# Patient Record
Sex: Female | Born: 1965 | Race: Black or African American | Hispanic: No | Marital: Single | Smoking: Former smoker
Health system: Southern US, Community
[De-identification: ages and names within clinical notes are randomized; demographics above are authoritative.]

## PROBLEM LIST (undated history)

## (undated) DIAGNOSIS — L309 Dermatitis, unspecified: Secondary | ICD-10-CM

## (undated) DIAGNOSIS — I10 Essential (primary) hypertension: Secondary | ICD-10-CM

## (undated) DIAGNOSIS — I82890 Acute embolism and thrombosis of other specified veins: Secondary | ICD-10-CM

## (undated) DIAGNOSIS — K449 Diaphragmatic hernia without obstruction or gangrene: Secondary | ICD-10-CM

## (undated) DIAGNOSIS — N840 Polyp of corpus uteri: Secondary | ICD-10-CM

## (undated) HISTORY — DX: Polyp of corpus uteri: N84.0

## (undated) HISTORY — PX: TUBAL LIGATION: SHX77

## (undated) HISTORY — DX: Dermatitis, unspecified: L30.9

## (undated) HISTORY — PX: OTHER SURGICAL HISTORY: SHX169

## (undated) HISTORY — DX: Diaphragmatic hernia without obstruction or gangrene: K44.9

---

## 2006-02-27 ENCOUNTER — Emergency Department (HOSPITAL_COMMUNITY): Admission: EM | Admit: 2006-02-27 | Discharge: 2006-02-27 | Payer: Self-pay | Admitting: Emergency Medicine

## 2006-06-14 ENCOUNTER — Ambulatory Visit (HOSPITAL_COMMUNITY): Admission: RE | Admit: 2006-06-14 | Discharge: 2006-06-14 | Payer: Self-pay | Admitting: Gastroenterology

## 2006-08-10 ENCOUNTER — Emergency Department (HOSPITAL_COMMUNITY): Admission: EM | Admit: 2006-08-10 | Discharge: 2006-08-10 | Payer: Self-pay | Admitting: Family Medicine

## 2006-09-30 ENCOUNTER — Emergency Department (HOSPITAL_COMMUNITY): Admission: EM | Admit: 2006-09-30 | Discharge: 2006-09-30 | Payer: Self-pay | Admitting: Emergency Medicine

## 2007-01-23 ENCOUNTER — Emergency Department (HOSPITAL_COMMUNITY): Admission: EM | Admit: 2007-01-23 | Discharge: 2007-01-23 | Payer: Self-pay | Admitting: Family Medicine

## 2007-03-09 ENCOUNTER — Emergency Department (HOSPITAL_COMMUNITY): Admission: EM | Admit: 2007-03-09 | Discharge: 2007-03-10 | Payer: Self-pay | Admitting: Emergency Medicine

## 2008-07-21 ENCOUNTER — Emergency Department (HOSPITAL_COMMUNITY): Admission: EM | Admit: 2008-07-21 | Discharge: 2008-07-21 | Payer: Self-pay | Admitting: Family Medicine

## 2008-08-21 ENCOUNTER — Emergency Department (HOSPITAL_COMMUNITY): Admission: EM | Admit: 2008-08-21 | Discharge: 2008-08-21 | Payer: Self-pay | Admitting: Emergency Medicine

## 2009-01-19 ENCOUNTER — Emergency Department (HOSPITAL_COMMUNITY): Admission: EM | Admit: 2009-01-19 | Discharge: 2009-01-19 | Payer: Self-pay | Admitting: Family Medicine

## 2010-10-30 ENCOUNTER — Emergency Department (HOSPITAL_COMMUNITY)
Admission: EM | Admit: 2010-10-30 | Discharge: 2010-10-30 | Payer: Self-pay | Source: Home / Self Care | Admitting: Emergency Medicine

## 2011-02-18 NOTE — Op Note (Signed)
NAMECARLEENA, MIRES               ACCOUNT NO.:  [id number]   MEDICAL RECORD NO.:  [id number]          PATIENT TYPE:  AMB   LOCATION:  ENDO                         FACILITY:  [facility]   PHYSICIAN:  James L. Malon Kindle., M.D.DATE OF BIRTH:  10-Sep-1966   DATE OF PROCEDURE:  DATE OF DISCHARGE:  06/14/2006                                 OPERATIVE REPORT   DATE OF PROCEDURE:  06/14/2006   PROCEDURE:  Esophageal monometry.   INDICATION:  Dysphagia with a normal barium swallow.   DESCRIPTION OF PROCEDURE:  The procedure was performed in the usual manner,  no provocative studies were performed.  The five channel express protocol  was used.  The results are as follows:  1. Upper esophageal sphincter appears to relax, pharyngeal spikes are      present.  2. Esophageal body peristalsis was normal and 12 __________ with a mean      amplitude in the distal esophagus 164 mm a mean duration 4.7 seconds      within the normal range.  3. Lower esophageal sphincter mean pressure 43.9 mm within the normal      range appeared to relax on the tracings the computer measured the      relaxation 84% probably normal.   ASSESSMENT:  Essentially normal monometry.   PLAN:  We will follow the patient up in the office.           ______________________________  Llana Aliment. Malon Kindle., M.D.     Waldron Session  D:  07/05/2006  T:  07/06/2006  Job:  [id number]   cc:   Molly Maduro A. Nicholos Johns, M.D.

## 2011-04-20 ENCOUNTER — Emergency Department (HOSPITAL_COMMUNITY)
Admission: EM | Admit: 2011-04-20 | Discharge: 2011-04-20 | Disposition: A | Discharge status: Home / Self Care | Payer: BC Managed Care – PPO | Attending: Emergency Medicine | Admitting: Emergency Medicine

## 2011-04-20 DIAGNOSIS — J45901 Unspecified asthma with (acute) exacerbation: Secondary | ICD-10-CM | POA: Insufficient documentation

## 2011-04-20 DIAGNOSIS — R0989 Other specified symptoms and signs involving the circulatory and respiratory systems: Secondary | ICD-10-CM | POA: Insufficient documentation

## 2011-04-20 DIAGNOSIS — Z8701 Personal history of pneumonia (recurrent): Secondary | ICD-10-CM | POA: Insufficient documentation

## 2011-04-20 DIAGNOSIS — R0609 Other forms of dyspnea: Secondary | ICD-10-CM | POA: Insufficient documentation

## 2011-07-04 ENCOUNTER — Inpatient Hospital Stay (INDEPENDENT_AMBULATORY_CARE_PROVIDER_SITE_OTHER)
Admission: RE | Admit: 2011-07-04 | Discharge: 2011-07-04 | Disposition: A | Discharge status: Home / Self Care | Payer: BC Managed Care – PPO | Source: Ambulatory Visit | Attending: Family Medicine | Admitting: Family Medicine

## 2011-07-04 ENCOUNTER — Ambulatory Visit (INDEPENDENT_AMBULATORY_CARE_PROVIDER_SITE_OTHER): Payer: BC Managed Care – PPO

## 2011-07-04 DIAGNOSIS — IMO0002 Reserved for concepts with insufficient information to code with codable children: Secondary | ICD-10-CM

## 2011-07-21 LAB — BASIC METABOLIC PANEL
BUN: 13
Chloride: 107
Creatinine, Ser: 0.75
GFR calc Af Amer: >60
Glucose, Bld: 102 — ABNORMAL HIGH
Potassium: 3.7
Sodium: 138

## 2011-07-21 LAB — DIFFERENTIAL
Basophils Relative: 0
Eosinophils Absolute: 0.6
Monocytes Absolute: 0.5
Monocytes Relative: 5
Neutrophils Relative %: 63

## 2011-07-21 LAB — CBC
MCHC: 33.6
MCV: 80.3
RBC: 4.61

## 2011-08-23 ENCOUNTER — Other Ambulatory Visit (HOSPITAL_COMMUNITY): Payer: BC Managed Care – PPO | Admitting: Radiology

## 2011-08-29 ENCOUNTER — Other Ambulatory Visit (HOSPITAL_COMMUNITY): Payer: Self-pay | Admitting: Family Medicine

## 2011-08-29 DIAGNOSIS — R609 Edema, unspecified: Secondary | ICD-10-CM

## 2011-08-30 ENCOUNTER — Ambulatory Visit (HOSPITAL_COMMUNITY): Payer: BC Managed Care – PPO | Attending: Cardiology | Admitting: Radiology

## 2011-08-30 ENCOUNTER — Other Ambulatory Visit (HOSPITAL_COMMUNITY): Payer: BC Managed Care – PPO | Admitting: Radiology

## 2011-08-30 DIAGNOSIS — I059 Rheumatic mitral valve disease, unspecified: Secondary | ICD-10-CM | POA: Insufficient documentation

## 2011-08-30 DIAGNOSIS — R609 Edema, unspecified: Secondary | ICD-10-CM | POA: Insufficient documentation

## 2011-08-30 DIAGNOSIS — I379 Nonrheumatic pulmonary valve disorder, unspecified: Secondary | ICD-10-CM | POA: Insufficient documentation

## 2011-08-30 DIAGNOSIS — I079 Rheumatic tricuspid valve disease, unspecified: Secondary | ICD-10-CM | POA: Insufficient documentation

## 2011-08-30 DIAGNOSIS — I319 Disease of pericardium, unspecified: Secondary | ICD-10-CM | POA: Insufficient documentation

## 2011-08-31 ENCOUNTER — Encounter (HOSPITAL_COMMUNITY): Payer: Self-pay | Admitting: Family Medicine

## 2011-09-26 ENCOUNTER — Ambulatory Visit (INDEPENDENT_AMBULATORY_CARE_PROVIDER_SITE_OTHER): Payer: BC Managed Care – PPO | Admitting: Cardiology

## 2011-09-26 ENCOUNTER — Encounter: Payer: Self-pay | Admitting: Cardiology

## 2011-09-26 DIAGNOSIS — R931 Abnormal findings on diagnostic imaging of heart and coronary circulation: Secondary | ICD-10-CM | POA: Insufficient documentation

## 2011-09-26 DIAGNOSIS — Z136 Encounter for screening for cardiovascular disorders: Secondary | ICD-10-CM

## 2011-09-26 DIAGNOSIS — R9389 Abnormal findings on diagnostic imaging of other specified body structures: Secondary | ICD-10-CM

## 2011-09-26 DIAGNOSIS — R0683 Snoring: Secondary | ICD-10-CM

## 2011-09-26 DIAGNOSIS — R0609 Other forms of dyspnea: Secondary | ICD-10-CM

## 2011-09-26 DIAGNOSIS — R609 Edema, unspecified: Secondary | ICD-10-CM

## 2011-09-26 DIAGNOSIS — I10 Essential (primary) hypertension: Secondary | ICD-10-CM

## 2011-09-26 DIAGNOSIS — E669 Obesity, unspecified: Secondary | ICD-10-CM

## 2011-09-26 NOTE — Assessment & Plan Note (Signed)
We had a long discussion about this and I prescribed the [employer].

## 2011-09-26 NOTE — Assessment & Plan Note (Signed)
Her blood pressure is mildly elevated today but has not otherwise and she checks it frequently on her job.

## 2011-09-26 NOTE — Patient Instructions (Addendum)
Please lab work work today. (BNP)  The current medical regimen is effective;  continue present plan and medications.  Your physician has requested that you have an exercise tolerance test. For further information please visit [email or web address] Please also follow instruction sheet, as given.  Your physician has recommended that you have a sleep study. This test records several body functions during sleep, including: brain activity, eye movement, oxygen and carbon dioxide blood levels, heart rate and rhythm, breathing rate and rhythm, the flow of air through your mouth and nose, snoring, body muscle movements, and chest and belly movement.  Sleep Apnea Sleep apnea is when a person's sleep is disrupted many times. This makes a person tired during the day. There are two types of sleep apnea. One is when breathing stops for a short time because your airway is blocked (obstructive sleep apnea). The other type is when the brain sometimes fails to give the normal signal to breathe (central sleep apnea).  HOME CARE  Do not sleep on your back.   Take all medicine as told by your doctor.   Avoid alcohol, calming medicines (sedatives), and depressant drugs.   Use your CPAP (a mask-like machine and pump) to keep your airway open while sleeping.   If you are overweight, try to lose some weight. Talk to your doctor about a healthy weight goal.   If the problem is related to heart failure, take medicines as told by your doctor.  MAKE SURE YOU:  Understand these instructions.   Will watch your condition.   Will get help right away if you are not doing well or get worse.  Document Released: 06/28/2008 Document Revised: 06/01/2011 Document Reviewed: 06/28/2008 [facility] Patient Information 2012 [place], [place]. [facility] Diet  2 Gram Low Sodium Diet A 2 gram sodium diet restricts the amount of sodium in the diet to no more than 2 g or 2000 mg daily. Limiting the amount of sodium is often used to  help lower blood pressure. It is important if you have heart, liver, or kidney problems. Many foods contain sodium for flavor and sometimes as a preservative. When the amount of sodium in a diet needs to be low, it is important to know what to look for when choosing foods and drinks. The following includes some information and guidelines to help make it easier for you to adapt to a low sodium diet. QUICK TIPS  Do not add salt to food.   Avoid convenience items and fast food.   Choose unsalted snack foods.   Buy lower sodium products, often labeled as "lower sodium" or "no salt added."   Check food labels to learn how much sodium is in 1 serving.   When eating at a restaurant, ask that your food be prepared with less salt or none, if possible.  READING FOOD LABELS FOR SODIUM INFORMATION The nutrition facts label is a good place to find how much sodium is in foods. Look for products with no more than 500 to 600 mg of sodium per meal and no more than 150 mg per serving. Remember that 2 g = 2000 mg. The food label may also list foods as:  Sodium-free: Less than 5 mg in a serving.   Very low sodium: 35 mg or less in a serving.   Low-sodium: 140 mg or less in a serving.   Light in sodium: 50% less sodium in a serving. For example, if a food that usually has 300 mg of sodium is changed to  become light in sodium, it will have 150 mg of sodium.   Reduced sodium: 25% less sodium in a serving. For example, if a food that usually has 400 mg of sodium is changed to reduced sodium, it will have 300 mg of sodium.  CHOOSING FOODS Grains  Avoid: Salted crackers and snack items. Some cereals, including instant hot cereals. Bread stuffing and biscuit mixes. Seasoned rice or pasta mixes.   Choose: Unsalted snack items. Low-sodium cereals, oats, puffed wheat and rice, shredded wheat. English muffins and bread. Pasta.  Meats  Avoid: Salted, canned, smoked, spiced, pickled meats, including fish and  poultry. Bacon, ham, sausage, cold cuts, hot dogs, anchovies.   Choose: Low-sodium canned tuna and salmon. Fresh or frozen meat, poultry, and fish.  Dairy  Avoid: Processed cheese and spreads. Cottage cheese. Buttermilk and condensed milk. Regular cheese.   Choose: Milk. Low-sodium cottage cheese. Yogurt. Sour cream. Low-sodium cheese.  Fruits and Vegetables  Avoid: Regular canned vegetables. Regular canned tomato sauce and paste. Frozen vegetables in sauces. Olives. Rosita Fire. Relishes. Sauerkraut.   Choose: Low-sodium canned vegetables. Low-sodium tomato sauce and paste. Frozen or fresh vegetables. Fresh and frozen fruit.  Condiments  Avoid: Canned and packaged gravies. Worcestershire sauce. Tartar sauce. Barbecue sauce. Soy sauce. Steak sauce. Ketchup. Onion, garlic, and table salt. Meat flavorings and tenderizers.   Choose: Fresh and dried herbs and spices. Low-sodium varieties of mustard and ketchup. Lemon juice. Tabasco sauce. Horseradish.  SAMPLE 2 GRAM SODIUM MEAL PLAN Breakfast / Sodium (mg)  1 cup low-fat milk / 143 mg   2 slices whole-wheat toast / 270 mg   1 tbs heart-healthy margarine / 153 mg   1 hard-boiled egg / 139 mg   1 small orange / 0 mg  Lunch / Sodium (mg)  1 cup raw carrots / 76 mg    cup hummus / 298 mg   1 cup low-fat milk / 143 mg    cup red grapes / 2 mg   1 whole-wheat pita bread / 356 mg  Dinner / Sodium (mg)  1 cup whole-wheat pasta / 2 mg   1 cup low-sodium tomato sauce / 73 mg   3 oz lean ground beef / 57 mg   1 small side salad (1 cup raw spinach leaves,  cup cucumber,  cup yellow bell pepper) with 1 tsp olive oil and 1 tsp red wine vinegar / 25 mg  Snack / Sodium (mg)  1 container low-fat vanilla yogurt / 107 mg   3 graham cracker squares / 127 mg  Nutrient Analysis  Calories: 2033   Protein: 77 g   Carbohydrate: 282 g   Fat: 72 g   Sodium: 1971 mg  Document Released: 09/19/2005 Document Revised: 06/01/2011  Document Reviewed: 12/21/2009 [facility] Patient Information 2012 [place], [place].

## 2011-09-26 NOTE — Assessment & Plan Note (Signed)
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 This does not need further evaluation. However, the mildly reduced ejection fraction with questionable regional wall motion abnormality will be further investigated.

## 2011-09-26 NOTE — Assessment & Plan Note (Signed)
She has snoring and other symptoms of sleep apnea and she will have a sleep study.

## 2011-09-26 NOTE — Assessment & Plan Note (Signed)
To further evaluate her edema in the abnormal echo I will check a BNP level. I would also like to bring her back for an exercise treadmill test to try to exclude obstructive coronary disease. I will bring the patient back for a POET (Plain Old Exercise Test). This will allow me to screen for obstructive coronary disease, risk stratify and very importantly provide a prescription for exercise.

## 2011-09-26 NOTE — Progress Notes (Signed)
HPI The patient is asked for evaluation of an abnormal echocardiogram. She has no prior cardiac history. However, this year over the past 3 or 4 months she has had 11 pounds of weight gain.  She reports that this was despite dieting and without a change in her diet.  She was having some increased edema. She was treated with po diuretics which reduced her weight and improve her swelling.  She's now taking these when necessary. Her referring doctor sent her for an echocardiogram which demonstrated an EF to be 50-55% with mild possible inferior hypokinesis. 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  The patient denies any new cardiovascular symptoms. She has always slept on 5 pillows because of asthma. She denies chest pressure, neck or arm discomfort. She denies any palpitations, presyncope or syncope. She does get dyspneic related to her asthma but this has been a chronic problem. She's not describing PND or orthopnea. She's had no cough. She can climb stairs without bringing on any of the symptoms.  Allergies  Allergen Reactions  . Penicillins Nausea And Vomiting    Current Outpatient Prescriptions  Medication Sig Dispense Refill  . albuterol (PROVENTIL) (2.5 MG/3ML) 0.083% nebulizer solution Take 2.5 mg by nebulization every 6 (six) hours as needed.        Marland Kitchen albuterol-ipratropium (COMBIVENT) 18-103 MCG/ACT inhaler Inhale 2 puffs into the lungs every 6 (six) hours as needed.        . budesonide-formoterol (SYMBICORT) 80-4.5 MCG/ACT inhaler Inhale 2 puffs into the lungs 2 (two) times daily.        . cetirizine (ZYRTEC) 10 MG tablet Take 10 mg by mouth daily.        . fluticasone (FLONASE) 50 MCG/ACT nasal spray Place 2 sprays into the nose daily.        . furosemide (LASIX) 20 MG tablet       . montelukast (SINGULAIR) 10 MG tablet Take 10 mg by mouth at bedtime.          Past Medical History  Diagnosis Date  . Asthma     Past Surgical History  Procedure Date  . Tubal  ligation   . Blood clot     After a pregnancy 26 years ago    Family History  Problem Relation Age of Onset  . Hypertension Father     History   Social History  . Marital Status: Single    Spouse Name: N/A    Number of Children: 4  . Years of Education: N/A   Occupational History  . 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   Social History Main Topics  . Smoking status: Former Smoker -- 0.3 packs/day for 20 years    Types: Cigarettes    Quit date: 02/24/2011  . Smokeless tobacco: Not on file  . Alcohol Use: Not on file  . Drug Use: Not on file  . Sexually Active: Not on file   Other Topics Concern  . Not on file   Social History Narrative  . No narrative on file    ROS    Headaches, wheezing, reflux, leg cramps. Otherwise as stated in the HPI and negative for all other systems.  PHYSICAL EXAM BP 128/96  Pulse 80  Resp 18  Ht 5\' 2"  (1.575 m)  Wt 244 lb 6.4 oz (110.859 kg)  BMI 44.70 kg/m2 GENERAL:  Well appearing HEENT:  Pupils equal round and reactive, fundi not visualized, oral mucosa unremarkable NECK:  No  jugular venous distention, waveform within normal limits, carotid upstroke brisk and symmetric, no bruits, no thyromegaly LYMPHATICS:  No cervical, inguinal adenopathy LUNGS:  Clear to auscultation bilaterally BACK:  No CVA tenderness CHEST:  Unremarkable HEART:  PMI not displaced or sustained,S1 and S2 within normal limits, no S3, no S4, no clicks, no rubs, no murmurs ABD:  Flat, positive bowel sounds normal in frequency in pitch, no bruits, no rebound, no guarding, no midline pulsatile mass, no hepatomegaly, no splenomegaly EXT:  2 plus pulses throughout, no edema, no cyanosis no clubbing SKIN:  No rashes no nodules NEURO:  Cranial nerves II through XII grossly intact, motor grossly intact throughout PSYCH:  Cognitively intact, oriented to person place and time   EKG:  Sinus rhythm, rate 80, RSR prime V1, intervals within normal limits, no acute ST-T wave  changes. 09/26/2011   ASSESSMENT AND PLAN

## 2011-10-13 ENCOUNTER — Encounter: Payer: BC Managed Care – PPO | Admitting: Physician Assistant

## 2011-10-26 ENCOUNTER — Encounter: Payer: BC Managed Care – PPO | Admitting: Physician Assistant

## 2011-11-04 ENCOUNTER — Ambulatory Visit (INDEPENDENT_AMBULATORY_CARE_PROVIDER_SITE_OTHER): Payer: BC Managed Care – PPO | Admitting: Physician Assistant

## 2011-11-04 DIAGNOSIS — I10 Essential (primary) hypertension: Secondary | ICD-10-CM

## 2011-11-04 DIAGNOSIS — R931 Abnormal findings on diagnostic imaging of heart and coronary circulation: Secondary | ICD-10-CM

## 2011-11-04 DIAGNOSIS — R9389 Abnormal findings on diagnostic imaging of other specified body structures: Secondary | ICD-10-CM

## 2011-11-04 NOTE — Progress Notes (Signed)
   Exercise Treadmill Test  Pre-Exercise Testing Evaluation Rhythm: normal sinus  Rate: 74   PR:  15 QRS:  .09  QT:  39 QTc: 42     Test  Exercise Tolerance Test Ordering MD: Angelina Sheriff, MD  Interpreting MD:  Tereso Newcomer PA-C  Unique Test No: 1  Treadmill:  1  Indication for ETT: Abnormal ECHO  Contraindication to ETT: No   Stress Modality: exercise - treadmill  Cardiac Imaging Performed: non   Protocol: standard Bruce - maximal  Max BP:  165/58  Max MPHR (bpm):  177 85% MPR (bpm):  150  MPHR obtained (bpm):  162 % MPHR obtained:  91%  Reached 85% MPHR (min:sec):  6:18 Total Exercise Time (min-sec):  8:19  Workload in METS:  9.9 Borg Scale: 15  Reason ETT Terminated:  patient's desire to stop    ST Segment Analysis At Rest: normal ST segments - no evidence of significant ST depression With Exercise: no evidence of significant ST depression  Other Information Arrhythmia:  No Angina during ETT:  absent (0) Quality of ETT:  diagnostic  ETT Interpretation:  normal - no evidence of ischemia by ST analysis  Comments: Good exercise tolerance. No chest pain. Normal BP response to exercise. No ST-T changes to suggest ischemia.   Recommendations: Follow up with Dr. Rollene Rotunda as directed. Tereso Newcomer, PA-C  12:14 PM 11/04/2011

## 2012-02-10 ENCOUNTER — Encounter: Payer: Self-pay | Admitting: Cardiology

## 2012-09-08 ENCOUNTER — Emergency Department (HOSPITAL_COMMUNITY)
Admission: EM | Admit: 2012-09-08 | Discharge: 2012-09-08 | Disposition: A | Discharge status: Home / Self Care | Payer: BC Managed Care – PPO | Attending: Emergency Medicine | Admitting: Emergency Medicine

## 2012-09-08 DIAGNOSIS — Y92410 Unspecified street and highway as the place of occurrence of the external cause: Secondary | ICD-10-CM | POA: Insufficient documentation

## 2012-09-08 DIAGNOSIS — Y9389 Activity, other specified: Secondary | ICD-10-CM | POA: Insufficient documentation

## 2012-09-08 DIAGNOSIS — J45909 Unspecified asthma, uncomplicated: Secondary | ICD-10-CM | POA: Insufficient documentation

## 2012-09-08 DIAGNOSIS — Z87891 Personal history of nicotine dependence: Secondary | ICD-10-CM | POA: Insufficient documentation

## 2012-09-08 DIAGNOSIS — Z79899 Other long term (current) drug therapy: Secondary | ICD-10-CM | POA: Insufficient documentation

## 2012-09-08 DIAGNOSIS — M5380 Other specified dorsopathies, site unspecified: Secondary | ICD-10-CM | POA: Insufficient documentation

## 2012-09-08 DIAGNOSIS — M62830 Muscle spasm of back: Secondary | ICD-10-CM

## 2012-09-08 MED ORDER — MELOXICAM 15 MG PO TABS: 15.0000 mg | ORAL_TABLET | Freq: Every day | ORAL | Status: DC

## 2012-09-08 MED ORDER — OXYCODONE-ACETAMINOPHEN 5-325 MG PO TABS: 2.0000 | ORAL_TABLET | ORAL | Status: DC | PRN

## 2012-09-08 MED ORDER — METHOCARBAMOL 750 MG PO TABS: 750.0000 mg | ORAL_TABLET | Freq: Four times a day (QID) | ORAL | Status: DC
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
## 2012-09-08 NOTE — ED Notes (Signed)
Pt states she drove herself here.

## 2012-09-09 NOTE — ED Provider Notes (Signed)
Medical screening examination/treatment/procedure(s) were performed by non-physician practitioner and as supervising physician I was immediately available for consultation/collaboration  Derwood Kaplan, MD 09/09/12 4098

## 2012-09-12 ENCOUNTER — Encounter (HOSPITAL_COMMUNITY): Payer: Self-pay | Admitting: *Deleted

## 2012-09-12 ENCOUNTER — Emergency Department (HOSPITAL_COMMUNITY)
Admission: EM | Admit: 2012-09-12 | Discharge: 2012-09-12 | Disposition: A | Discharge status: Home / Self Care | Payer: BC Managed Care – PPO | Attending: Emergency Medicine | Admitting: Emergency Medicine

## 2012-09-12 DIAGNOSIS — J45909 Unspecified asthma, uncomplicated: Secondary | ICD-10-CM | POA: Insufficient documentation

## 2012-09-12 DIAGNOSIS — Z87891 Personal history of nicotine dependence: Secondary | ICD-10-CM | POA: Insufficient documentation

## 2012-09-12 DIAGNOSIS — J029 Acute pharyngitis, unspecified: Secondary | ICD-10-CM

## 2012-09-12 DIAGNOSIS — Z79899 Other long term (current) drug therapy: Secondary | ICD-10-CM | POA: Insufficient documentation

## 2012-09-12 DIAGNOSIS — Z862 Personal history of diseases of the blood and blood-forming organs and certain disorders involving the immune mechanism: Secondary | ICD-10-CM | POA: Insufficient documentation

## 2012-09-12 HISTORY — DX: Acute embolism and thrombosis of other specified veins: I82.890

## 2012-09-12 MED ORDER — PREDNISONE 20 MG PO TABS
60.0000 mg | ORAL_TABLET | Freq: Once | ORAL | Status: DC
  Filled 2012-09-12: qty 3

## 2012-09-12 MED ORDER — PREDNISONE 20 MG PO TABS: 60.0000 mg | ORAL_TABLET | Freq: Every day | ORAL | Status: AC

## 2012-09-12 MED ORDER — HYDROCODONE-ACETAMINOPHEN 5-325 MG PO TABS: 2.0000 | ORAL_TABLET | ORAL | Status: DC | PRN

## 2012-09-12 MED ORDER — DEXAMETHASONE SODIUM PHOSPHATE 10 MG/ML IJ SOLN
10.0000 mg | Freq: Once | INTRAMUSCULAR | Status: AC
  Administered 2012-09-12: 10 mg via INTRAMUSCULAR
  Filled 2012-09-12: qty 1

## 2012-09-12 NOTE — ED Provider Notes (Signed)
History     CSN: [id number]  Arrival date & time 09/12/12  1100   First MD Initiated Contact with Patient 09/12/12 1126      Chief Complaint  Patient presents with  . Sore Throat    (Consider location/radiation/quality/duration/timing/severity/associated sxs/prior treatment) Patient is a 46 y.o. female presenting with pharyngitis. The history is provided by the patient.  Sore Throat This is a new problem. The current episode started today. Pertinent negatives include no chest pain, chills, congestion, coughing, fever, neck pain, rash or sore throat. Associated symptoms comments: She woke this morning with the sensation that her throat was swelling. No pain. She denies symptoms of congestion, sinus pressure, fever, or cough. She was prescribed new medications on 09/08/12 (Flexeril) and reports she has been taking them without side effect or symptoms. No other new medications. She reports being able to eat and drink this morning. Swelling was there when she got up this morning (x 2 hours ago) and symptoms are the same without getting better or worse. Marland Kitchen    Past Medical History  Diagnosis Date  . Asthma   . Blood clot in abdominal vein     Past Surgical History  Procedure Date  . Tubal ligation   . Blood clot     After a pregnancy 26 years ago    Family History  Problem Relation Age of Onset  . Hypertension Father     History  Substance Use Topics  . Smoking status: Former Smoker -- 0.3 packs/day for 20 years    Types: Cigarettes    Quit date: 02/24/2011  . Smokeless tobacco: Not on file  . Alcohol Use: Yes     Comment: occasionally    OB History    Grav Para Term Preterm Abortions TAB SAB Ect Mult Living                  Review of Systems  Constitutional: Negative for fever and chills.  HENT: Negative.  Negative for congestion, sore throat, trouble swallowing and neck pain.        See HPI.  Respiratory: Negative.  Negative for cough and shortness of breath.    Cardiovascular: Negative.  Negative for chest pain.  Musculoskeletal: Negative.   Skin: Negative.  Negative for rash.    Allergies  Penicillins  Home Medications   Current Outpatient Rx  Name  Route  Sig  Dispense  Refill  . ALBUTEROL SULFATE (2.5 MG/3ML) 0.083% IN NEBU   Nebulization   Take 2.5 mg by nebulization every 6 (six) hours as needed. Wheezing         . CETIRIZINE HCL 10 MG PO TABS   Oral   Take 10 mg by mouth daily.           . IBUPROFEN 200 MG PO TABS   Oral   Take 200 mg by mouth every 6 (six) hours as needed. Pain         . MELOXICAM 15 MG PO TABS   Oral   Take 1 tablet (15 mg total) by mouth daily.   10 tablet   0   . METHOCARBAMOL 750 MG PO TABS   Oral   Take 1 tablet (750 mg total) by mouth 4 (four) times daily.   20 tablet   0   . MONTELUKAST SODIUM 10 MG PO TABS   Oral   Take 10 mg by mouth at bedtime.  BP 116/56  Pulse 91  Temp 98.2 F (36.8 C) (Oral)  Resp 21  SpO2 99%  Physical Exam  Constitutional: She is oriented to person, place, and time. She appears well-developed and well-nourished.  HENT:  Head: Normocephalic.       Mildly edematous uvula and oropharynx. No exudate. No palpable cervical or submental lymph nodes.   Neck: Normal range of motion. Neck supple.  Cardiovascular: Normal rate and regular rhythm.   Pulmonary/Chest: Effort normal and breath sounds normal. No stridor.  Abdominal: Soft. There is no tenderness. There is no rebound and no guarding.  Musculoskeletal: Normal range of motion.  Neurological: She is alert and oriented to person, place, and time.  Skin: Skin is warm and dry. No rash noted.  Psychiatric: She has a normal mood and affect.    ED Course  Procedures (including critical care time)  Labs Reviewed - No data to display No results found.   No diagnosis found.   1. pharyngitis  MDM  Will stop medications given on 09/08/12. DDx: Viral vs medication related. Decadron given.  Plan - will observe for another hour to insure no progression or new symptoms, then discharge home.        Rodena Medin, PA-C 09/12/12 1219

## 2012-09-12 NOTE — ED Notes (Signed)
Pt reports feeling like throat is swelling. Woke up with this sensation. No new meds or foods. Pt has muffled voice. Points to pain being in center of neck. Feels as if she can't swallow well, controlling secretions at this time.

## 2012-09-12 NOTE — ED Notes (Signed)
Pt has asked that her prednisone be changed to an IM med.  PA informed and is changing ordered.

## 2012-09-13 NOTE — ED Provider Notes (Signed)
Medical screening examination/treatment/procedure(s) were performed by non-physician practitioner and as supervising physician I was immediately available for consultation/collaboration.  John-Adam Brantley Wiley, M.D.     John-Adam Jetson Pickrel, MD 09/13/12 1449 

## 2012-10-08 ENCOUNTER — Ambulatory Visit: Payer: BC Managed Care – PPO | Attending: Family Medicine | Admitting: Physical Therapy

## 2012-10-08 DIAGNOSIS — IMO0001 Reserved for inherently not codable concepts without codable children: Secondary | ICD-10-CM | POA: Insufficient documentation

## 2012-10-08 DIAGNOSIS — M2569 Stiffness of other specified joint, not elsewhere classified: Secondary | ICD-10-CM | POA: Insufficient documentation

## 2012-10-08 DIAGNOSIS — M546 Pain in thoracic spine: Secondary | ICD-10-CM | POA: Insufficient documentation

## 2012-10-11 ENCOUNTER — Ambulatory Visit: Payer: BC Managed Care – PPO | Attending: Family Medicine | Admitting: Physical Therapy

## 2012-10-11 DIAGNOSIS — M2569 Stiffness of other specified joint, not elsewhere classified: Secondary | ICD-10-CM | POA: Insufficient documentation

## 2012-10-11 DIAGNOSIS — M546 Pain in thoracic spine: Secondary | ICD-10-CM | POA: Insufficient documentation

## 2012-10-11 DIAGNOSIS — IMO0001 Reserved for inherently not codable concepts without codable children: Secondary | ICD-10-CM | POA: Insufficient documentation

## 2012-10-15 ENCOUNTER — Ambulatory Visit: Payer: Self-pay | Admitting: Physical Therapy

## 2012-10-15 ENCOUNTER — Ambulatory Visit: Payer: BC Managed Care – PPO | Attending: Family Medicine | Admitting: Physical Therapy

## 2012-10-15 DIAGNOSIS — M546 Pain in thoracic spine: Secondary | ICD-10-CM | POA: Insufficient documentation

## 2012-10-15 DIAGNOSIS — M2569 Stiffness of other specified joint, not elsewhere classified: Secondary | ICD-10-CM | POA: Insufficient documentation

## 2012-10-15 DIAGNOSIS — IMO0001 Reserved for inherently not codable concepts without codable children: Secondary | ICD-10-CM | POA: Insufficient documentation

## 2012-10-18 ENCOUNTER — Ambulatory Visit: Payer: BC Managed Care – PPO | Attending: Family Medicine | Admitting: Physical Therapy

## 2012-10-18 DIAGNOSIS — M2569 Stiffness of other specified joint, not elsewhere classified: Secondary | ICD-10-CM | POA: Insufficient documentation

## 2012-10-18 DIAGNOSIS — IMO0001 Reserved for inherently not codable concepts without codable children: Secondary | ICD-10-CM | POA: Insufficient documentation

## 2012-10-18 DIAGNOSIS — M546 Pain in thoracic spine: Secondary | ICD-10-CM | POA: Insufficient documentation

## 2012-10-22 ENCOUNTER — Ambulatory Visit: Payer: BC Managed Care – PPO | Admitting: Physical Therapy

## 2012-10-25 ENCOUNTER — Ambulatory Visit: Payer: BC Managed Care – PPO | Admitting: Physical Therapy

## 2012-10-29 ENCOUNTER — Ambulatory Visit: Payer: BC Managed Care – PPO | Attending: Family Medicine | Admitting: Physical Therapy

## 2012-10-30 ENCOUNTER — Ambulatory Visit: Payer: BC Managed Care – PPO | Attending: Family Medicine | Admitting: Physical Therapy

## 2012-10-30 DIAGNOSIS — M2569 Stiffness of other specified joint, not elsewhere classified: Secondary | ICD-10-CM | POA: Insufficient documentation

## 2012-10-30 DIAGNOSIS — M546 Pain in thoracic spine: Secondary | ICD-10-CM | POA: Insufficient documentation

## 2012-10-30 DIAGNOSIS — IMO0001 Reserved for inherently not codable concepts without codable children: Secondary | ICD-10-CM | POA: Insufficient documentation

## 2012-11-01 ENCOUNTER — Ambulatory Visit: Payer: BC Managed Care – PPO | Attending: Family Medicine | Admitting: Physical Therapy

## 2012-11-01 DIAGNOSIS — M546 Pain in thoracic spine: Secondary | ICD-10-CM | POA: Insufficient documentation

## 2012-11-01 DIAGNOSIS — M2569 Stiffness of other specified joint, not elsewhere classified: Secondary | ICD-10-CM | POA: Insufficient documentation

## 2012-11-01 DIAGNOSIS — IMO0001 Reserved for inherently not codable concepts without codable children: Secondary | ICD-10-CM | POA: Insufficient documentation

## 2012-11-05 ENCOUNTER — Ambulatory Visit: Payer: BC Managed Care – PPO | Attending: Family Medicine | Admitting: Physical Therapy

## 2012-11-05 DIAGNOSIS — IMO0001 Reserved for inherently not codable concepts without codable children: Secondary | ICD-10-CM | POA: Insufficient documentation

## 2012-11-05 DIAGNOSIS — M25619 Stiffness of unspecified shoulder, not elsewhere classified: Secondary | ICD-10-CM | POA: Insufficient documentation

## 2012-11-05 DIAGNOSIS — M546 Pain in thoracic spine: Secondary | ICD-10-CM | POA: Insufficient documentation

## 2012-11-08 ENCOUNTER — Ambulatory Visit: Payer: BC Managed Care – PPO | Attending: Family Medicine | Admitting: Physical Therapy

## 2012-11-08 DIAGNOSIS — M546 Pain in thoracic spine: Secondary | ICD-10-CM | POA: Insufficient documentation

## 2012-11-08 DIAGNOSIS — IMO0001 Reserved for inherently not codable concepts without codable children: Secondary | ICD-10-CM | POA: Insufficient documentation

## 2012-11-08 DIAGNOSIS — M25619 Stiffness of unspecified shoulder, not elsewhere classified: Secondary | ICD-10-CM | POA: Insufficient documentation

## 2014-06-02 ENCOUNTER — Other Ambulatory Visit: Payer: Self-pay | Admitting: Infectious Disease

## 2014-06-02 ENCOUNTER — Ambulatory Visit
Admission: RE | Admit: 2014-06-02 | Discharge: 2014-06-02 | Disposition: A | Discharge status: Home / Self Care | Payer: No Typology Code available for payment source | Source: Ambulatory Visit | Attending: Infectious Disease | Admitting: Infectious Disease

## 2014-06-02 DIAGNOSIS — R7611 Nonspecific reaction to tuberculin skin test without active tuberculosis: Secondary | ICD-10-CM

## 2014-08-16 ENCOUNTER — Inpatient Hospital Stay (HOSPITAL_COMMUNITY)
Admission: EM | Admit: 2014-08-16 | Discharge: 2014-08-18 | DRG: 193 | Disposition: A | Discharge status: Home / Self Care | Payer: BC Managed Care – PPO | Attending: Internal Medicine | Admitting: Internal Medicine

## 2014-08-16 ENCOUNTER — Encounter (HOSPITAL_COMMUNITY): Payer: Self-pay

## 2014-08-16 ENCOUNTER — Emergency Department (HOSPITAL_COMMUNITY): Payer: BC Managed Care – PPO

## 2014-08-16 DIAGNOSIS — I10 Essential (primary) hypertension: Secondary | ICD-10-CM | POA: Diagnosis present

## 2014-08-16 DIAGNOSIS — Z880 Allergy status to penicillin: Secondary | ICD-10-CM | POA: Diagnosis not present

## 2014-08-16 DIAGNOSIS — E876 Hypokalemia: Secondary | ICD-10-CM | POA: Diagnosis present

## 2014-08-16 DIAGNOSIS — Z87891 Personal history of nicotine dependence: Secondary | ICD-10-CM | POA: Diagnosis not present

## 2014-08-16 DIAGNOSIS — J9601 Acute respiratory failure with hypoxia: Secondary | ICD-10-CM | POA: Diagnosis present

## 2014-08-16 DIAGNOSIS — D72829 Elevated white blood cell count, unspecified: Secondary | ICD-10-CM | POA: Diagnosis present

## 2014-08-16 DIAGNOSIS — J189 Pneumonia, unspecified organism: Secondary | ICD-10-CM | POA: Insufficient documentation

## 2014-08-16 DIAGNOSIS — R0602 Shortness of breath: Secondary | ICD-10-CM | POA: Insufficient documentation

## 2014-08-16 DIAGNOSIS — J45901 Unspecified asthma with (acute) exacerbation: Secondary | ICD-10-CM | POA: Diagnosis present

## 2014-08-16 DIAGNOSIS — Z6841 Body Mass Index (BMI) 40.0 and over, adult: Secondary | ICD-10-CM

## 2014-08-16 DIAGNOSIS — T501X5A Adverse effect of loop [high-ceiling] diuretics, initial encounter: Secondary | ICD-10-CM | POA: Diagnosis present

## 2014-08-16 DIAGNOSIS — E669 Obesity, unspecified: Secondary | ICD-10-CM | POA: Diagnosis present

## 2014-08-16 LAB — MRSA PCR SCREENING: MRSA by PCR: NEGATIVE

## 2014-08-16 LAB — CBC WITH DIFFERENTIAL/PLATELET
Basophils Absolute: 0 10*3/uL (ref 0.0–0.1)
Basophils Relative: 0 % (ref 0–1)
Eosinophils Absolute: 0.3 10*3/uL (ref 0.0–0.7)
Eosinophils Relative: 3 % (ref 0–5)
HEMATOCRIT: 36.7 % (ref 36.0–46.0)
HEMOGLOBIN: 13 g/dL (ref 12.0–15.0)
Lymphocytes Relative: 10 % — ABNORMAL LOW (ref 12–46)
Lymphs Abs: 1.2 10*3/uL (ref 0.7–4.0)
MCH: 28.2 pg (ref 26.0–34.0)
MCHC: 35.4 g/dL (ref 30.0–36.0)
MCV: 79.6 fL (ref 78.0–100.0)
MONO ABS: 0.5 10*3/uL (ref 0.1–1.0)
MONOS PCT: 4 % (ref 3–12)
Neutro Abs: 10 10*3/uL — ABNORMAL HIGH (ref 1.7–7.7)
Neutrophils Relative %: 83 % — ABNORMAL HIGH (ref 43–77)
Platelets: 195 10*3/uL (ref 150–400)
RBC: 4.61 MIL/uL (ref 3.87–5.11)
RDW: 14.6 % (ref 11.5–15.5)
WBC: 12 10*3/uL — ABNORMAL HIGH (ref 4.0–10.5)

## 2014-08-16 LAB — HIV ANTIBODY (ROUTINE TESTING W REFLEX): HIV 1&2 Ab, 4th Generation: NONREACTIVE

## 2014-08-16 LAB — EXPECTORATED SPUTUM ASSESSMENT W REFEX TO RESP CULTURE

## 2014-08-16 LAB — I-STAT CHEM 8, ED
BUN: 11 mg/dL (ref 6–23)
CALCIUM ION: 1.18 mmol/L (ref 1.12–1.23)
Chloride: 105 mEq/L (ref 96–112)
Creatinine, Ser: 0.7 mg/dL (ref 0.50–1.10)
GLUCOSE: 118 mg/dL — AB (ref 70–99)
HCT: 41 % (ref 36.0–46.0)
Hemoglobin: 13.9 g/dL (ref 12.0–15.0)
POTASSIUM: 3.2 meq/L — AB (ref 3.7–5.3)
Sodium: 143 mEq/L (ref 137–147)
TCO2: 21 mmol/L (ref 0–100)

## 2014-08-16 LAB — EXPECTORATED SPUTUM ASSESSMENT W GRAM STAIN, RFLX TO RESP C

## 2014-08-16 LAB — STREP PNEUMONIAE URINARY ANTIGEN: Strep Pneumo Urinary Antigen: NEGATIVE

## 2014-08-16 MED ORDER — SODIUM CHLORIDE 0.9 % IV SOLN: 250.0000 mL | INTRAVENOUS | Status: DC | PRN

## 2014-08-16 MED ORDER — IPRATROPIUM-ALBUTEROL 0.5-2.5 (3) MG/3ML IN SOLN
3.0000 mL | Freq: Once | RESPIRATORY_TRACT | Status: AC
  Administered 2014-08-16: 3 mL via RESPIRATORY_TRACT
  Filled 2014-08-16: qty 3

## 2014-08-16 MED ORDER — SODIUM CHLORIDE 0.9 % IV SOLN: INTRAVENOUS | Status: DC

## 2014-08-16 MED ORDER — LORATADINE 10 MG PO TABS
10.0000 mg | ORAL_TABLET | Freq: Every day | ORAL | Status: DC
Start: 2014-08-16 — End: 2014-08-18
  Administered 2014-08-16 – 2014-08-18 (×3): 10 mg via ORAL
  Filled 2014-08-16 (×3): qty 1

## 2014-08-16 MED ORDER — LEVOFLOXACIN IN D5W 750 MG/150ML IV SOLN: 750.0000 mg | INTRAVENOUS | Status: DC

## 2014-08-16 MED ORDER — SODIUM CHLORIDE 0.9 % IJ SOLN
3.0000 mL | Freq: Two times a day (BID) | INTRAMUSCULAR | Status: DC
  Administered 2014-08-16 – 2014-08-17 (×4): 3 mL via INTRAVENOUS

## 2014-08-16 MED ORDER — MAGNESIUM SULFATE 2 GM/50ML IV SOLN
2.0000 g | Freq: Once | INTRAVENOUS | Status: DC
  Filled 2014-08-16: qty 50

## 2014-08-16 MED ORDER — METHYLPREDNISOLONE SODIUM SUCC 125 MG IJ SOLR
60.0000 mg | Freq: Two times a day (BID) | INTRAMUSCULAR | Status: DC
  Administered 2014-08-16 – 2014-08-18 (×5): 60 mg via INTRAVENOUS
  Filled 2014-08-16 (×6): qty 0.96
  Filled 2014-08-16: qty 2

## 2014-08-16 MED ORDER — ACETAMINOPHEN 650 MG RE SUPP: 650.0000 mg | Freq: Four times a day (QID) | RECTAL | Status: DC | PRN

## 2014-08-16 MED ORDER — MONTELUKAST SODIUM 10 MG PO TABS
10.0000 mg | ORAL_TABLET | Freq: Every day | ORAL | Status: DC
  Administered 2014-08-16 – 2014-08-17 (×2): 10 mg via ORAL
  Filled 2014-08-16 (×3): qty 1

## 2014-08-16 MED ORDER — ALBUTEROL SULFATE (2.5 MG/3ML) 0.083% IN NEBU: 5.0000 mg | INHALATION_SOLUTION | Freq: Once | RESPIRATORY_TRACT | Status: DC

## 2014-08-16 MED ORDER — METHYLPREDNISOLONE SODIUM SUCC 125 MG IJ SOLR
125.0000 mg | Freq: Once | INTRAMUSCULAR | Status: AC
  Administered 2014-08-16: 125 mg via INTRAVENOUS
  Filled 2014-08-16: qty 2

## 2014-08-16 MED ORDER — ALBUTEROL SULFATE (2.5 MG/3ML) 0.083% IN NEBU
5.0000 mg | INHALATION_SOLUTION | Freq: Once | RESPIRATORY_TRACT | Status: AC
  Administered 2014-08-16: 5 mg via RESPIRATORY_TRACT
  Filled 2014-08-16 (×2): qty 6

## 2014-08-16 MED ORDER — FUROSEMIDE 20 MG PO TABS
20.0000 mg | ORAL_TABLET | Freq: Every day | ORAL | Status: DC
  Administered 2014-08-16 – 2014-08-18 (×3): 20 mg via ORAL
  Filled 2014-08-16 (×3): qty 1

## 2014-08-16 MED ORDER — IPRATROPIUM-ALBUTEROL 0.5-2.5 (3) MG/3ML IN SOLN
3.0000 mL | RESPIRATORY_TRACT | Status: DC | PRN
  Administered 2014-08-16: 3 mL via RESPIRATORY_TRACT
  Filled 2014-08-16: qty 3

## 2014-08-16 MED ORDER — ALBUTEROL SULFATE (2.5 MG/3ML) 0.083% IN NEBU
5.0000 mg | INHALATION_SOLUTION | Freq: Once | RESPIRATORY_TRACT | Status: AC
  Administered 2014-08-16: 5 mg via RESPIRATORY_TRACT
  Filled 2014-08-16: qty 6

## 2014-08-16 MED ORDER — LEVOFLOXACIN IN D5W 750 MG/150ML IV SOLN
750.0000 mg | INTRAVENOUS | Status: DC
  Administered 2014-08-17 – 2014-08-18 (×2): 750 mg via INTRAVENOUS
  Filled 2014-08-16 (×2): qty 150

## 2014-08-16 MED ORDER — ACETAMINOPHEN 325 MG PO TABS: 650.0000 mg | ORAL_TABLET | Freq: Four times a day (QID) | ORAL | Status: DC | PRN

## 2014-08-16 MED ORDER — MAGNESIUM SULFATE 50 % IJ SOLN: 2.0000 g | Freq: Once | INTRAMUSCULAR | Status: DC

## 2014-08-16 MED ORDER — IPRATROPIUM-ALBUTEROL 0.5-2.5 (3) MG/3ML IN SOLN
3.0000 mL | RESPIRATORY_TRACT | Status: DC
  Administered 2014-08-16 – 2014-08-18 (×14): 3 mL via RESPIRATORY_TRACT
  Filled 2014-08-16 (×15): qty 3

## 2014-08-16 MED ORDER — SODIUM CHLORIDE 0.9 % IJ SOLN: 3.0000 mL | INTRAMUSCULAR | Status: DC | PRN

## 2014-08-16 MED ORDER — POTASSIUM CHLORIDE CRYS ER 20 MEQ PO TBCR
40.0000 meq | EXTENDED_RELEASE_TABLET | Freq: Once | ORAL | Status: AC
  Administered 2014-08-16: 40 meq via ORAL
  Filled 2014-08-16: qty 2

## 2014-08-16 MED ORDER — HYDROCODONE-ACETAMINOPHEN 5-325 MG PO TABS
2.0000 | ORAL_TABLET | ORAL | Status: DC | PRN
  Administered 2014-08-16: 2 via ORAL
  Administered 2014-08-17 (×2): 1 via ORAL
  Filled 2014-08-16: qty 1
  Filled 2014-08-16: qty 2
  Filled 2014-08-16: qty 1

## 2014-08-16 MED ORDER — LEVOFLOXACIN IN D5W 750 MG/150ML IV SOLN
750.0000 mg | Freq: Once | INTRAVENOUS | Status: AC
  Administered 2014-08-16: 750 mg via INTRAVENOUS
  Filled 2014-08-16: qty 150

## 2014-08-16 MED ORDER — LEVOFLOXACIN IN D5W 750 MG/150ML IV SOLN
750.0000 mg | INTRAVENOUS | Status: DC
  Filled 2014-08-16: qty 150

## 2014-08-16 MED ORDER — SODIUM CHLORIDE 0.9 % IV SOLN
Freq: Once | INTRAVENOUS | Status: AC
  Administered 2014-08-16: 02:00:00 via INTRAVENOUS

## 2014-08-16 MED ORDER — RIFAMPIN 300 MG PO CAPS
600.0000 mg | ORAL_CAPSULE | Freq: Every day | ORAL | Status: DC
  Administered 2014-08-16 – 2014-08-18 (×3): 600 mg via ORAL
  Filled 2014-08-16 (×3): qty 2

## 2014-08-16 NOTE — H&P (Signed)
[facility] History and Physical  Christina Robles XHB:[id number] DOB: 1966-07-14 DOA: 08/16/2014  Referring physician: ER physician PCP: Phineas Inches, MD   Chief Complaint: shortness of breath   HPI:  48 year old female with past medical history of hypertension, asthma, obesity who presented to [facility] ED 08/16/2014 with worsening shortness of breath with occasionally productive cough, subjective fevers. Patient reported symptoms started 24 hours PTA and did not get better with nebulizer treatments at home. No complaints of chest pain, palpitations. No complaints of abdominal pain, nausea or vomiting. No reports of blood in stool or urine. No lightheadedness or loss of consciousness.  In ED, vitals were stable. O2 saturation was 92% with [place] oxygen support. Blood work showed WBC count of 2, potassium of 3.2. CXR showed bilateral opacities and pneumonia suspected. Pt started on Levaquin. She was given nebulizer treatments in ED along with solumedrol 125 mg once IV. She was admitted for further evaluation and management of asthma exacerbation and pneumonia.   Assessment & Plan    Principal Problem:   Acute respiratory failure with hypoxia / Acute asthma exacerbation - hypoxic resp failure likely due to combination of asthma exacerbation and pneumonia  - started duoneb every 4 hours scheduled and every 4 hours PRN shortness of breath or wheezing - start solumedrol 60 mg IV Q 12 hours - oxygen support via [place] to keep O2 saturation above 90% Active Problems: CAP (community acquired pneumonia) /  Leukocytosis - pneumonia order set ordered - follow up blood cultures, resp culture, legionella, strep pneumonia and HIV results  - continue Levaquin daily HTN (hypertension) - continue lasix 20 mg daily  Hypokalemia - secondary to lasix - supplemented  Obesity - diet discussed with the pt    DVT prophylaxis:  - SCD's bilaterally   Radiological Exams on Admission: Dg Chest 2  View 08/16/2014   Bilateral opacities. Pneumonia is suspected. Radiographic follow-up after appropriate therapy recommended.   Electronically Signed   By: Skipper Cliche M.D.   On: 08/16/2014 01:43     Code Status: Full Family Communication: Plan of care discussed with the patient  Disposition Plan: Admit for further evaluation  Leisa Lenz, MD  [facility] Pager [phone]  Review of Systems:  Constitutional: Negative for fever, chills and malaise/fatigue. Negative for diaphoresis.  HENT: Negative for hearing loss, ear pain, nosebleeds, congestion, sore throat, neck pain, tinnitus and ear discharge.   Eyes: Negative for blurred vision, double vision, photophobia, pain, discharge and redness.  Respiratory: per HPI.   Cardiovascular: Negative for chest pain, palpitations, orthopnea, claudication and leg swelling.  Gastrointestinal: Negative for nausea, vomiting and abdominal pain. Negative for heartburn, constipation, blood in stool and melena.  Genitourinary: Negative for dysuria, urgency, frequency, hematuria and flank pain.  Musculoskeletal: Negative for myalgias, back pain, joint pain and falls.  Skin: Negative for itching and rash.  Neurological: Negative for dizziness and weakness. Negative for tingling, tremors, sensory change, speech change, focal weakness, loss of consciousness and headaches.  Endo/Heme/Allergies: Negative for environmental allergies and polydipsia. Does not bruise/bleed easily.  Psychiatric/Behavioral: Negative for suicidal ideas. The patient is not nervous/anxious.      Past Medical History  Diagnosis Date  . Asthma   . Blood clot in abdominal vein    Past Surgical History  Procedure Laterality Date  . Tubal ligation    . Blood clot      After a pregnancy 26 years ago   Social History:  reports that she quit smoking about 3  years ago. Her smoking use included Cigarettes. She has a 6 pack-year smoking history. She does not have any smokeless  tobacco history on file. She reports that she drinks alcohol. She reports that she does not use illicit drugs.  Allergies  Allergen Reactions  . Penicillins Nausea And Vomiting    Family History:  Family History  Problem Relation Age of Onset  . Hypertension Father      Prior to Admission medications   Medication Sig Start Date End Date Taking? Authorizing Provider  cetirizine (ZYRTEC) 10 MG tablet Take 10 mg by mouth daily.     Yes Historical Provider, MD  furosemide (LASIX) 20 MG tablet Take 20 mg by mouth daily.   Yes Historical Provider, MD  ibuprofen (ADVIL,MOTRIN) 200 MG tablet Take 200 mg by mouth every 6 (six) hours as needed. Pain   Yes Historical Provider, MD  montelukast (SINGULAIR) 10 MG tablet Take 10 mg by mouth at bedtime.     Yes Historical Provider, MD  rifampin (RIFADIN) 300 MG capsule Take 600 mg by mouth daily.   Yes Historical Provider, MD  albuterol (PROVENTIL) (2.5 MG/3ML) 0.083% nebulizer solution Take 2.5 mg by nebulization every 6 (six) hours as needed. Wheezing    Historical Provider, MD  HYDROcodone-acetaminophen (NORCO/VICODIN) 5-325 MG per tablet Take 2 tablets by mouth every 4 (four) hours as needed for pain. 09/12/12   Dewaine Oats, PA-C   Physical Exam: Filed Vitals:   08/16/14 0819 08/16/14 0909 08/16/14 0916 08/16/14 1235  BP: 132/67  119/66   Pulse: 100  100   Temp:   98 F (36.7 C)   TempSrc:   Oral   Resp: 16  14   Height:   5\' 2"  (1.575 m)   SpO2: 96% 98% 98% 97%    Physical Exam  Constitutional: Appears well-developed and well-nourished. No distress.  HENT: Normocephalic. No tonsillar erythema or exudates Eyes: Conjunctivae and EOM are normal. PERRLA, no scleral icterus.  Neck: Normal ROM. Neck supple. No JVD. No tracheal deviation. No thyromegaly.  CVS: RRR, S1/S2 +, no murmurs, no gallops, no carotid bruit.  Pulmonary: wheezing in upper and mid lung lobes, no crackles.  Abdominal: Soft. BS +,  no distension, tenderness, rebound  or guarding.  Musculoskeletal: Normal range of motion. No edema and no tenderness.  Lymphadenopathy: No lymphadenopathy noted, cervical, inguinal. Neuro: Alert. Normal reflexes, muscle tone coordination. No focal neurologic deficits. Skin: Skin is warm and dry. No rash noted. Not diaphoretic. No erythema. No pallor.  Psychiatric: Normal mood and affect. Behavior, judgment, thought content normal.   Labs on Admission:  Basic Metabolic Panel:  Recent Labs Lab 08/16/14 0333  NA 143  K 3.2*  CL 105  GLUCOSE 118*  BUN 11  CREATININE 0.70   Liver Function Tests: No results for input(s): AST, ALT, ALKPHOS, BILITOT, PROT, ALBUMIN in the last 168 hours. No results for input(s): LIPASE, AMYLASE in the last 168 hours. No results for input(s): AMMONIA in the last 168 hours. CBC:  Recent Labs Lab 08/16/14 0320 08/16/14 0333  WBC 12.0*  --   NEUTROABS 10.0*  --   HGB 13.0 13.9  HCT 36.7 41.0  MCV 79.6  --   PLT 195  --    Cardiac Enzymes: No results for input(s): CKTOTAL, CKMB, CKMBINDEX, TROPONINI in the last 168 hours. BNP: Invalid input(s): POCBNP CBG: No results for input(s): GLUCAP in the last 168 hours.  If 7PM-7AM, please contact night-coverage [email or web address] Password [email or web address] 08/16/2014, 2:16  PM       

## 2014-08-16 NOTE — ED Notes (Signed)
Pt presents with c/o asthma, symptoms started yesterday. Pt reports she has had 3 breathing treatments, last one at 10:30. Pt with audible wheezing in triage at this time.

## 2014-08-16 NOTE — ED Provider Notes (Signed)
CSN: [id number]     Arrival date & time 08/16/14  0032 History   First MD Initiated Contact with Patient 08/16/14 0117     Chief Complaint  Patient presents with  . Asthma     (Consider location/radiation/quality/duration/timing/severity/associated sxs/prior Treatment) Patient is a 48 y.o. female presenting with asthma. The history is provided by the patient.  Asthma This is a recurrent problem. The current episode started today. The problem occurs constantly. The problem has been unchanged. Associated symptoms include coughing. Pertinent negatives include no fever, nausea, numbness, vomiting or weakness. The symptoms are aggravated by exertion. Treatments tried: albuterol. The treatment provided no relief.    Past Medical History  Diagnosis Date  . Asthma   . Blood clot in abdominal vein    Past Surgical History  Procedure Laterality Date  . Tubal ligation    . Blood clot      After a pregnancy 26 years ago   Family History  Problem Relation Age of Onset  . Hypertension Father    History  Substance Use Topics  . Smoking status: Former Smoker -- 0.30 packs/day for 20 years    Types: Cigarettes    Quit date: 02/24/2011  . Smokeless tobacco: Not on file  . Alcohol Use: Yes     Comment: occasionally   OB History    No data available     Review of Systems  Constitutional: Negative for fever.  Respiratory: Positive for cough, shortness of breath and wheezing.   Gastrointestinal: Negative for nausea and vomiting.  Neurological: Negative for weakness and numbness.  All other systems reviewed and are negative.     Allergies  Penicillins  Home Medications   Prior to Admission medications   Medication Sig Start Date End Date Taking? Authorizing Provider  cetirizine (ZYRTEC) 10 MG tablet Take 10 mg by mouth daily.     Yes Historical Provider, MD  furosemide (LASIX) 20 MG tablet Take 20 mg by mouth daily.   Yes Historical Provider, MD  ibuprofen (ADVIL,MOTRIN) 200  MG tablet Take 200 mg by mouth every 6 (six) hours as needed. Pain   Yes Historical Provider, MD  montelukast (SINGULAIR) 10 MG tablet Take 10 mg by mouth at bedtime.     Yes Historical Provider, MD  rifampin (RIFADIN) 300 MG capsule Take 600 mg by mouth daily.   Yes Historical Provider, MD  albuterol (PROVENTIL) (2.5 MG/3ML) 0.083% nebulizer solution Take 2.5 mg by nebulization every 6 (six) hours as needed. Wheezing    Historical Provider, MD  HYDROcodone-acetaminophen (NORCO/VICODIN) 5-325 MG per tablet Take 2 tablets by mouth every 4 (four) hours as needed for pain. 09/12/12   Shari A Upstill, PA-C   BP 113/68 mmHg  Pulse 77  Temp(Src) 97.6 F (36.4 C) (Oral)  Resp 16  Ht 5\' 2"  (1.575 m)  Wt 244 lb (110.678 kg)  BMI 44.62 kg/m2  SpO2 95% Physical Exam  Constitutional: She is oriented to person, place, and time. She appears well-developed and well-nourished.  HENT:  Head: Normocephalic.  Eyes: Pupils are equal, round, and reactive to light.  Neck: Normal range of motion.  Cardiovascular: Regular rhythm.  Tachycardia present.   Pulmonary/Chest: Effort normal. She has wheezes.  Abdominal: Soft. She exhibits no distension.  Musculoskeletal: Normal range of motion.  Neurological: She is alert and oriented to person, place, and time.  Skin: Skin is warm.  Nursing note and vitals reviewed.   ED Course  Procedures (including critical care time) Labs Review Labs  Reviewed  CBC WITH DIFFERENTIAL - Abnormal; Notable for the following:    WBC 12.0 (*)    Neutrophils Relative % 83 (*)    Neutro Abs 10.0 (*)    Lymphocytes Relative 10 (*)    All other components within normal limits  I-STAT CHEM 8, ED - Abnormal; Notable for the following:    Potassium 3.2 (*)    Glucose, Bld 118 (*)    All other components within normal limits  MRSA PCR SCREENING  CULTURE, EXPECTORATED SPUTUM-ASSESSMENT  CULTURE, BLOOD (ROUTINE X 2)  CULTURE, BLOOD (ROUTINE X 2)  CULTURE, RESPIRATORY  (NON-EXPECTORATED)  HIV ANTIBODY (ROUTINE TESTING)  STREP PNEUMONIAE URINARY ANTIGEN  LEGIONELLA ANTIGEN, URINE  INFLUENZA PANEL BY PCR (TYPE A & B, H1N1)  COMPREHENSIVE METABOLIC PANEL  CBC    Imaging Review Dg Chest 2 View  08/16/2014   CLINICAL DATA:  Initial evaluation for shortness of breath wheezing chest pain anteriorly and posteriorly, symptoms began yesterday, quit smoking in 2012, history of asthma  EXAM: CHEST  2 VIEW  COMPARISON:  06/02/2014  FINDINGS: Suboptimal inspiratory effect. The heart size and vascular pattern are normal. There is a wide band of opacity in the right lower lobe. There is more rounded opacity in the medial right lung base. There is more diffuse indistinct opacity in the left lower lobe. No pleural effusions.  IMPRESSION: Bilateral opacities. Pneumonia is suspected. Radiographic follow-up after appropriate therapy recommended.   Electronically Signed   By: Skipper Cliche M.D.   On: 08/16/2014 01:43     EKG Interpretation None     pateint to be admitted for continued respiratory treatment and IV antibiotics  Was going to give IV magnesium continued wheezing but patient has turned a corner and is longer wheezing at this time.  Magnesium has been put on hold MDM   Final diagnoses:  SOB (shortness of breath)  CAP (community acquired pneumonia)         Garald Balding, NP 08/16/14 0445  Garald Balding, NP 08/16/14 2143  Merryl Hacker, MD 08/18/14 1622

## 2014-08-17 DIAGNOSIS — E876 Hypokalemia: Secondary | ICD-10-CM

## 2014-08-17 DIAGNOSIS — E669 Obesity, unspecified: Secondary | ICD-10-CM

## 2014-08-17 DIAGNOSIS — D72829 Elevated white blood cell count, unspecified: Secondary | ICD-10-CM

## 2014-08-17 LAB — COMPREHENSIVE METABOLIC PANEL
ALK PHOS: 112 U/L (ref 39–117)
ALT: 33 U/L (ref 0–35)
AST: 24 U/L (ref 0–37)
Albumin: 3.6 g/dL (ref 3.5–5.2)
Anion gap: 13 (ref 5–15)
BUN: 11 mg/dL (ref 6–23)
CO2: 23 meq/L (ref 19–32)
Calcium: 9.4 mg/dL (ref 8.4–10.5)
Chloride: 106 mEq/L (ref 96–112)
Creatinine, Ser: 0.66 mg/dL (ref 0.50–1.10)
GFR calc Af Amer: >90 mL/min (ref >90)
GFR calc non Af Amer: >90 mL/min (ref >90)
GLUCOSE: 96 mg/dL (ref 70–99)
POTASSIUM: 4.2 meq/L (ref 3.7–5.3)
SODIUM: 142 meq/L (ref 137–147)
TOTAL PROTEIN: 7.5 g/dL (ref 6.0–8.3)
Total Bilirubin: <0.2 mg/dL — ABNORMAL LOW (ref 0.3–1.2)

## 2014-08-17 LAB — CBC
HEMATOCRIT: 35.2 % — AB (ref 36.0–46.0)
Hemoglobin: 12.4 g/dL (ref 12.0–15.0)
MCH: 28.1 pg (ref 26.0–34.0)
MCHC: 35.2 g/dL (ref 30.0–36.0)
MCV: 79.8 fL (ref 78.0–100.0)
PLATELETS: 207 10*3/uL (ref 150–400)
RBC: 4.41 MIL/uL (ref 3.87–5.11)
RDW: 14.8 % (ref 11.5–15.5)
WBC: 9.8 10*3/uL (ref 4.0–10.5)

## 2014-08-17 LAB — INFLUENZA PANEL BY PCR (TYPE A & B)
H1N1 flu by pcr: NOT DETECTED
Influenza A By PCR: NEGATIVE
Influenza B By PCR: NEGATIVE

## 2014-08-17 NOTE — Progress Notes (Addendum)
[facility] PROGRESS NOTE  Christina Robles FTD:[id number] DOB: 01-27-1966 DOA: 08/16/2014 PCP: Phineas Inches, MD  Assessment/Plan: 1-SOB due to asthma exacerbation and CAP -continue nebulizer treatment (schedule and PRN orders given); continue pulmicort and solumedrol (strting tapering) -PRN oxygen supplementation -continue antibiotics  2-CAP: continue levaquin -no fever  3-HTN: fair well controlled -continue lasix and discussed with patient low sodium diet  4-hypokalemia: resolved now -will monitor  5-Morbid obesity: discussion regarding low calorie diet and exercise   Code Status: Full Family Communication: no family at bedside Disposition Plan: home when breathing stabilize   Consultants:  None   Procedures:  See below for x-ray reports   Antibiotics:  levaquin   HPI/Subjective: Feeling better and breathing easier. No fever. Still with some wheezing, SOB and difficulty speaking in full sentences  Objective: Filed Vitals:   08/17/14 2103  BP: 112/67  Pulse: 79  Temp: 97.5 F (36.4 C)  Resp: 18    Intake/Output Summary (Last 24 hours) at 08/17/14 2326 Last data filed at 08/17/14 1832  Gross per 24 hour  Intake    870 ml  Output      0 ml  Net    870 ml   Filed Weights   08/16/14 0916 08/17/14 0445  Weight: 110.678 kg (244 lb) 110.632 kg (243 lb 14.4 oz)    Exam:   General:  Feeling better, breathing easier; no CP. Still with Some SOB and wheezing  Cardiovascular: S1 and S2, no rubs, no gallops, no murmurs  Respiratory: scattered rhonchi, diffuse exp wheezing; no crackles   Abdomen: soft, NT, ND, positive BS  Musculoskeletal: no cyanosis or erythema appreciated  Data Reviewed: Basic Metabolic Panel:  Recent Labs Lab 08/16/14 0333 08/17/14 0518  NA 143 142  K 3.2* 4.2  CL 105 106  CO2  --  23  GLUCOSE 118* 96  BUN 11 11  CREATININE 0.70 0.66  CALCIUM  --  9.4   Liver Function Tests:  Recent Labs Lab 08/17/14 0518   AST 24  ALT 33  ALKPHOS 112  BILITOT <0.2*  PROT 7.5  ALBUMIN 3.6   CBC:  Recent Labs Lab 08/16/14 0320 08/16/14 0333 08/17/14 0518  WBC 12.0*  --  9.8  NEUTROABS 10.0*  --   --   HGB 13.0 13.9 12.4  HCT 36.7 41.0 35.2*  MCV 79.6  --  79.8  PLT 195  --  207   Cardiac Enzymes: No results for input(s): CKTOTAL, CKMB, CKMBINDEX, TROPONINI in the last 168 hours. BNP (last 3 results) No results for input(s): PROBNP in the last 8760 hours. CBG: No results for input(s): GLUCAP in the last 168 hours.  Recent Results (from the past 240 hour(s))  MRSA PCR Screening     Status: None   Collection Time: 08/16/14  9:49 AM  Result Value Ref Range Status   MRSA by PCR NEGATIVE NEGATIVE Final    Comment:        The GeneXpert MRSA Assay (FDA approved for NASAL specimens only), is one component of a comprehensive MRSA colonization surveillance program. It is not intended to diagnose MRSA infection nor to guide or monitor treatment for MRSA infections.   Culture, blood (routine x 2) Call MD if unable to obtain prior to antibiotics being given     Status: None (Preliminary result)   Collection Time: 08/16/14  3:00 PM  Result Value Ref Range Status   Specimen Description BLOOD RIGHT ANTECUBITAL  Final   Special Requests BOTTLES DRAWN  AEROBIC AND ANAEROBIC 10 CC EACH  Final   Culture  Setup Time   Final    08/16/2014 20:44 Performed at [facility]    Culture   Final           BLOOD CULTURE RECEIVED NO GROWTH TO DATE CULTURE WILL BE HELD FOR 5 DAYS BEFORE ISSUING A FINAL NEGATIVE REPORT Performed at [facility]    Report Status PENDING  Incomplete  Culture, blood (routine x 2) Call MD if unable to obtain prior to antibiotics being given     Status: None (Preliminary result)   Collection Time: 08/16/14  3:17 PM  Result Value Ref Range Status   Specimen Description BLOOD LEFT HAND  Final   Special Requests BOTTLES DRAWN AEROBIC AND ANAEROBIC 4CC EACH  Final    Culture  Setup Time   Final    08/16/2014 20:44 Performed at [facility]    Culture   Final           BLOOD CULTURE RECEIVED NO GROWTH TO DATE CULTURE WILL BE HELD FOR 5 DAYS BEFORE ISSUING A FINAL NEGATIVE REPORT Performed at [facility]    Report Status PENDING  Incomplete  Culture, sputum-assessment     Status: None   Collection Time: 08/16/14  5:37 PM  Result Value Ref Range Status   Specimen Description SPUTUM  Final   Special Requests NONE  Final   Sputum evaluation   Final    THIS SPECIMEN IS ACCEPTABLE. RESPIRATORY CULTURE REPORT TO FOLLOW.   Report Status 08/16/2014 FINAL  Final  Culture, respiratory (NON-Expectorated)     Status: None (Preliminary result)   Collection Time: 08/16/14  5:37 PM  Result Value Ref Range Status   Specimen Description SPUTUM  Final   Special Requests NONE  Final   Gram Stain PENDING  Incomplete   Culture   Final    Culture reincubated for better growth Performed at [facility]    Report Status PENDING  Incomplete     Studies: Dg Chest 2 View  08/16/2014   CLINICAL DATA:  Initial evaluation for shortness of breath wheezing chest pain anteriorly and posteriorly, symptoms began yesterday, quit smoking in 2012, history of asthma  EXAM: CHEST  2 VIEW  COMPARISON:  06/02/2014  FINDINGS: Suboptimal inspiratory effect. The heart size and vascular pattern are normal. There is a wide band of opacity in the right lower lobe. There is more rounded opacity in the medial right lung base. There is more diffuse indistinct opacity in the left lower lobe. No pleural effusions.  IMPRESSION: Bilateral opacities. Pneumonia is suspected. Radiographic follow-up after appropriate therapy recommended.   Electronically Signed   By: Skipper Cliche M.D.   On: 08/16/2014 01:43    Scheduled Meds: . furosemide  20 mg Oral Daily  . ipratropium-albuterol  3 mL Nebulization Q4H  . levofloxacin (LEVAQUIN) IV  750 mg Intravenous Q24H  . loratadine   10 mg Oral Daily  . methylPREDNISolone (SOLU-MEDROL) injection  60 mg Intravenous Q12H  . montelukast  10 mg Oral QHS  . rifampin  600 mg Oral Daily  . sodium chloride  3 mL Intravenous Q12H   Continuous Infusions: . sodium chloride      Principal Problem:   Acute respiratory failure with hypoxia Active Problems:   HTN (hypertension)   Asthma exacerbation   Hypokalemia   Leukocytosis   CAP (community acquired pneumonia)    Time spent: 30 minutes  Barton Dubois  [facility] Pager [phone] If 7PM-7AM, please contact night-coverage at [email or web address], password [facility] 08/17/2014, 11:26 PM  LOS: 1 day

## 2014-08-18 DIAGNOSIS — R0602 Shortness of breath: Secondary | ICD-10-CM | POA: Insufficient documentation

## 2014-08-18 LAB — LEGIONELLA ANTIGEN, URINE

## 2014-08-18 LAB — GLUCOSE, CAPILLARY: Glucose-Capillary: 110 mg/dL — ABNORMAL HIGH (ref 70–99)

## 2014-08-18 MED ORDER — BUDESONIDE-FORMOTEROL FUMARATE 80-4.5 MCG/ACT IN AERO: 2.0000 | INHALATION_SPRAY | Freq: Two times a day (BID) | RESPIRATORY_TRACT | Status: DC

## 2014-08-18 MED ORDER — PREDNISONE 20 MG PO TABS: ORAL_TABLET | ORAL | Status: DC

## 2014-08-18 MED ORDER — LEVOFLOXACIN 750 MG PO TABS: 750.0000 mg | ORAL_TABLET | Freq: Every day | ORAL | Status: DC

## 2014-08-18 NOTE — Discharge Summary (Addendum)
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 Will follow with PCP in 10 days. Patient discharged home.  Diet recommendation: heart healthy and low calorie diet  Filed Weights   08/16/14 0916 08/17/14 0445 08/18/14 0507  Weight: 110.678 kg (244 lb) 110.632 kg (243 lb 14.4 oz) 108.138 kg (238 lb 6.4 oz)    History of present illness:  48 year old female with past medical history of hypertension, asthma, obesity who presented to [facility] ED 08/16/2014 with worsening shortness of breath with occasionally productive cough, subjective fevers. Patient reported symptoms started 24 hours PTA and did not get better with nebulizer treatments at home. No complaints of chest pain or palpitations. No complaints of abdominal pain, nausea or vomiting. No reports of blood in stool or urine. No lightheadedness or loss of consciousness.  In ED, vitals were stable. O2 saturation was 92% with [place] oxygen support. Blood work showed WBC count of 12, potassium of 3.2. CXR showed bilateral opacities and pneumonia suspected. Pt started on Levaquin and admitted for further evaluation and treatment given continues SOB and wheezing.  Hospital Course:  1-SOB due to asthma exacerbation and CAP -continue nebulizer treatment as needed; will discharge with steroid tapering and use of levaquin -continue BID symbicort -continue singulair -Good O2 sat on RA; no oxygen supplementation required  2-CAP: continue levaquin -no fever -no  hemoptysis -improve air movement at discharge and good O2 sat on RA (at rest and ambulation)  3-HTN:  -continue lasix and discussed with patient low sodium diet -BP well controlled  4-hypokalemia: resolved now -will recommend to check potassium during follow up visit  5-Morbid obesity: discussion regarding low calorie diet and exercise -patient encourage and advise to lose weight   6-positive Tuberculin test: -continue rifampin preventive therapy -plan is for 4 months treatment; currently coursing month #2  Procedures: See below for x-ray reports  Consultations:  None   Discharge Exam: Filed Vitals:   08/18/14 1407  BP: 111/65  Pulse: 94  Temp: 97.9 F (36.6 C)  Resp: 20    General: Feeling better, breathing a lot easier; no CP. Good O2 sat on RA while resting and ambulation. Just mild wheezing on exam  Cardiovascular: S1 and S2, no rubs, no gallops, no murmurs  Respiratory: scattered rhonchi, mild exp wheezing; no crackles; improve air movement   Abdomen: soft, NT, ND, positive BS  Musculoskeletal: no cyanosis or erythema appreciated   Discharge Instructions You were cared for by a hospitalist during your hospital stay. If you have any questions about your discharge medications or the care you received while you were in the hospital after you are discharged, you can call the unit and asked to speak with the hospitalist on call if the hospitalist that took care of you is not available. Once you are discharged, your primary care physician will handle any further medical issues. Please note that NO REFILLS for any discharge medications will be authorized once you are discharged, as it is imperative that you return to your primary care physician (or establish a relationship with a primary care physician if you do  not have one) for your aftercare needs so that they can reassess your need for medications and monitor your lab values.  Discharge Instructions    Discharge  instructions    Complete by:  As directed   Please take medications as prescribed Arrange follow up with PCP in 10 days Follow a low calorie diet Maintain yourself well hydrated          Discharge Medication List as of 08/18/2014  4:11 PM    START taking these medications   Details  levofloxacin (LEVAQUIN) 750 MG tablet Take 1 tablet (750 mg total) by mouth daily., Starting 08/18/2014, Until Discontinued, Print    predniSONE (DELTASONE) 20 MG tablet Take 3 tablets by mouth daily X 2 days; then 2 tablets by mouth daily X 2 days; then 1 tablet by mouth daily X 2 days; then 1/2 tablet by mouth daily X 3 days and stop prednisone, Print      CONTINUE these medications which have NOT CHANGED   Details  albuterol (PROVENTIL) (2.5 MG/3ML) 0.083% nebulizer solution Take 2.5 mg by nebulization every 6 (six) hours as needed. Wheezing, Until Discontinued, Historical Med    cetirizine (ZYRTEC) 10 MG tablet Take 10 mg by mouth daily.  , Until Discontinued, Historical Med    furosemide (LASIX) 20 MG tablet Take 20 mg by mouth daily., Until Discontinued, Historical Med    ibuprofen (ADVIL,MOTRIN) 200 MG tablet Take 200 mg by mouth every 6 (six) hours as needed. Pain, Until Discontinued, Historical Med    montelukast (SINGULAIR) 10 MG tablet Take 10 mg by mouth at bedtime.  , Until Discontinued, Historical Med    rifampin (RIFADIN) 300 MG capsule Take 600 mg by mouth daily., Until Discontinued, Historical Med      STOP taking these medications     HYDROcodone-acetaminophen (NORCO/VICODIN) 5-325 MG per tablet        Allergies  Allergen Reactions  . Penicillins Nausea And Vomiting   Follow-up Information    Follow up with BOUSKA,DAVID E, MD. Schedule an appointment as soon as possible for a visit in 10 days.   Specialty:  Family Medicine   Contact information:   [id number] [id number] [id number] [phone]        The results of significant diagnostics from this  hospitalization (including imaging, microbiology, ancillary and laboratory) are listed below for reference.    Significant Diagnostic Studies: Dg Chest 2 View  08/16/2014   CLINICAL DATA:  Initial evaluation for shortness of breath wheezing chest pain anteriorly and posteriorly, symptoms began yesterday, quit smoking in 2012, history of asthma  EXAM: CHEST  2 VIEW  COMPARISON:  06/02/2014  FINDINGS: Suboptimal inspiratory effect. The heart size and vascular pattern are normal. There is a wide band of opacity in the right lower lobe. There is more rounded opacity in the medial right lung base. There is more diffuse indistinct opacity in the left lower lobe. No pleural effusions.  IMPRESSION: Bilateral opacities. Pneumonia is suspected. Radiographic follow-up after appropriate therapy recommended.   Electronically Signed   By: Skipper Cliche M.D.   On: 08/16/2014 01:43    Microbiology: Recent Results (from the past 240 hour(s))  MRSA PCR Screening     Status: None   Collection Time: 08/16/14  9:49 AM  Result Value Ref Range Status   MRSA by PCR NEGATIVE NEGATIVE Final    Comment:        The GeneXpert MRSA Assay (FDA approved for NASAL specimens only), is  one component of a comprehensive MRSA colonization surveillance program. It is not intended to diagnose MRSA infection nor to guide or monitor treatment for MRSA infections.   Culture, blood (routine x 2) Call MD if unable to obtain prior to antibiotics being given     Status: None (Preliminary result)   Collection Time: 08/16/14  3:00 PM  Result Value Ref Range Status   Specimen Description BLOOD RIGHT ANTECUBITAL  Final   Special Requests BOTTLES DRAWN AEROBIC AND ANAEROBIC 10 CC EACH  Final   Culture  Setup Time   Final    08/16/2014 20:44 Performed at [facility]    Culture   Final           BLOOD CULTURE RECEIVED NO GROWTH TO DATE CULTURE WILL BE HELD FOR 5 DAYS BEFORE ISSUING A FINAL NEGATIVE REPORT Performed at  [facility]    Report Status PENDING  Incomplete  Culture, blood (routine x 2) Call MD if unable to obtain prior to antibiotics being given     Status: None (Preliminary result)   Collection Time: 08/16/14  3:17 PM  Result Value Ref Range Status   Specimen Description BLOOD LEFT HAND  Final   Special Requests BOTTLES DRAWN AEROBIC AND ANAEROBIC 4CC EACH  Final   Culture  Setup Time   Final    08/16/2014 20:44 Performed at [facility]    Culture   Final           BLOOD CULTURE RECEIVED NO GROWTH TO DATE CULTURE WILL BE HELD FOR 5 DAYS BEFORE ISSUING A FINAL NEGATIVE REPORT Performed at [facility]    Report Status PENDING  Incomplete  Culture, sputum-assessment     Status: None   Collection Time: 08/16/14  5:37 PM  Result Value Ref Range Status   Specimen Description SPUTUM  Final   Special Requests NONE  Final   Sputum evaluation   Final    THIS SPECIMEN IS ACCEPTABLE. RESPIRATORY CULTURE REPORT TO FOLLOW.   Report Status 08/16/2014 FINAL  Final  Culture, respiratory (NON-Expectorated)     Status: None (Preliminary result)   Collection Time: 08/16/14  5:37 PM  Result Value Ref Range Status   Specimen Description SPUTUM  Final   Special Requests NONE  Final   Gram Stain PENDING  Incomplete   Culture   Final    NORMAL OROPHARYNGEAL FLORA Performed at [facility]    Report Status PENDING  Incomplete     Labs: Basic Metabolic Panel:  Recent Labs Lab 08/16/14 0333 08/17/14 0518  NA 143 142  K 3.2* 4.2  CL 105 106  CO2  --  23  GLUCOSE 118* 96  BUN 11 11  CREATININE 0.70 0.66  CALCIUM  --  9.4   Liver Function Tests:  Recent Labs Lab 08/17/14 0518  AST 24  ALT 33  ALKPHOS 112  BILITOT <0.2*  PROT 7.5  ALBUMIN 3.6   CBC:  Recent Labs Lab 08/16/14 0320 08/16/14 0333 08/17/14 0518  WBC 12.0*  --  9.8  NEUTROABS 10.0*  --   --   HGB 13.0 13.9 12.4  HCT 36.7 41.0 35.2*  MCV 79.6  --  79.8  PLT 195  --  207    CBG:  Recent Labs Lab 08/18/14 0920  GLUCAP 110*    Signed:  Barton Dubois  [facility] 08/18/2014, 4:00 PM

## 2014-08-18 NOTE — Clinical Documentation Improvement (Addendum)
  Query #1 Please provide additional clinical indicators supportive of the documented diagnosis of Acute Respiratory Failure with Hypoxia. Flowsheets in [facility] record no supplemental oxygen use and respiratory rate in the low 20's.  Progress note 08/17/14 states that patient still has difficulty speaking in full sentences.   - Acute Respiratory Failure with Hypoxia does exist and additional clinical indicators documented in the medical record    - Acute Respiratory Failure with Hypoxia does not exist and amended documentation provided in the medical record   Query #2 "Obesity" documented in current medical record.  BMI per admission orders is 43.59.  Possible Clinical Conditions:  - Morbid Obesity, BMI 43.50  - Other Condition  - Unabe to Clinically Determine   Thank You, Erling Conte ,RN Clinical Documentation Specialist:  [phone] [facility]  Per nursing staff on admission, when reaching the floor patient had O2 sat in high 80's (documented because of this by my colleague as resp failure with hypoxia). Unfortunately I do not see that documentation now. Will go with SOB and asthma exacerbation due to PNA; instead of acute resp failure given lack of report.  Morbid obesity base on BMI; will make changes to progress note and discharge summary  Barton Dubois [phone]

## 2014-08-19 LAB — CULTURE, RESPIRATORY: Culture: NORMAL

## 2014-08-19 LAB — CULTURE, RESPIRATORY W GRAM STAIN

## 2014-08-22 LAB — CULTURE, BLOOD (ROUTINE X 2)
Culture: NO GROWTH
Culture: NO GROWTH

## 2014-11-17 ENCOUNTER — Emergency Department (HOSPITAL_COMMUNITY)
Admission: EM | Admit: 2014-11-17 | Discharge: 2014-11-17 | Disposition: A | Discharge status: Home / Self Care | Payer: BLUE CROSS/BLUE SHIELD | Attending: Emergency Medicine | Admitting: Emergency Medicine

## 2014-11-17 ENCOUNTER — Encounter (HOSPITAL_COMMUNITY): Payer: Self-pay | Admitting: Emergency Medicine

## 2014-11-17 ENCOUNTER — Emergency Department (HOSPITAL_COMMUNITY): Payer: BLUE CROSS/BLUE SHIELD

## 2014-11-17 DIAGNOSIS — Z86718 Personal history of other venous thrombosis and embolism: Secondary | ICD-10-CM | POA: Insufficient documentation

## 2014-11-17 DIAGNOSIS — Z87891 Personal history of nicotine dependence: Secondary | ICD-10-CM | POA: Insufficient documentation

## 2014-11-17 DIAGNOSIS — Z7952 Long term (current) use of systemic steroids: Secondary | ICD-10-CM | POA: Diagnosis not present

## 2014-11-17 DIAGNOSIS — J45909 Unspecified asthma, uncomplicated: Secondary | ICD-10-CM | POA: Diagnosis not present

## 2014-11-17 DIAGNOSIS — Z7951 Long term (current) use of inhaled steroids: Secondary | ICD-10-CM | POA: Insufficient documentation

## 2014-11-17 DIAGNOSIS — Z880 Allergy status to penicillin: Secondary | ICD-10-CM | POA: Diagnosis not present

## 2014-11-17 DIAGNOSIS — Z79899 Other long term (current) drug therapy: Secondary | ICD-10-CM | POA: Diagnosis not present

## 2014-11-17 DIAGNOSIS — R079 Chest pain, unspecified: Secondary | ICD-10-CM | POA: Diagnosis present

## 2014-11-17 DIAGNOSIS — R0789 Other chest pain: Secondary | ICD-10-CM | POA: Insufficient documentation

## 2014-11-17 DIAGNOSIS — R600 Localized edema: Secondary | ICD-10-CM | POA: Diagnosis not present

## 2014-11-17 LAB — BASIC METABOLIC PANEL
Anion gap: 5 (ref 5–15)
BUN: 16 mg/dL (ref 6–23)
CALCIUM: 8.6 mg/dL (ref 8.4–10.5)
CO2: 25 mmol/L (ref 19–32)
CREATININE: 0.77 mg/dL (ref 0.50–1.10)
Chloride: 111 mmol/L (ref 96–112)
Glucose, Bld: 100 mg/dL — ABNORMAL HIGH (ref 70–99)
Potassium: 3.1 mmol/L — ABNORMAL LOW (ref 3.5–5.1)
Sodium: 141 mmol/L (ref 135–145)

## 2014-11-17 LAB — CBC
HCT: 36.2 % (ref 36.0–46.0)
Hemoglobin: 12.4 g/dL (ref 12.0–15.0)
MCH: 27.9 pg (ref 26.0–34.0)
MCHC: 34.3 g/dL (ref 30.0–36.0)
MCV: 81.3 fL (ref 78.0–100.0)
PLATELETS: 245 10*3/uL (ref 150–400)
RBC: 4.45 MIL/uL (ref 3.87–5.11)
RDW: 14.2 % (ref 11.5–15.5)
WBC: 8.3 10*3/uL (ref 4.0–10.5)

## 2014-11-17 LAB — I-STAT TROPONIN, ED: TROPONIN I, POC: 0 ng/mL (ref 0.00–0.08)

## 2014-11-17 MED ORDER — NAPROXEN 500 MG PO TABS: 500.0000 mg | ORAL_TABLET | Freq: Two times a day (BID) | ORAL | Status: DC

## 2014-11-17 MED ORDER — ASPIRIN 325 MG PO TABS
325.0000 mg | ORAL_TABLET | Freq: Once | ORAL | Status: AC
  Administered 2014-11-17: 325 mg via ORAL
  Filled 2014-11-17: qty 1

## 2014-11-17 MED ORDER — POTASSIUM CHLORIDE CRYS ER 20 MEQ PO TBCR
40.0000 meq | EXTENDED_RELEASE_TABLET | Freq: Once | ORAL | Status: AC
  Administered 2014-11-17: 40 meq via ORAL
  Filled 2014-11-17: qty 2

## 2014-11-17 MED ORDER — MORPHINE SULFATE 4 MG/ML IJ SOLN
4.0000 mg | Freq: Once | INTRAMUSCULAR | Status: AC
  Administered 2014-11-17: 4 mg via INTRAVENOUS
  Filled 2014-11-17: qty 1

## 2014-11-17 NOTE — ED Notes (Addendum)
Patient states that she started having Chest pain yesterday. No hx of same. Also c/o back pain. Alert and oriented.

## 2014-11-17 NOTE — ED Provider Notes (Signed)
CSN: [id number]     Arrival date & time 11/17/14  0626 History   First MD Initiated Contact with Patient 11/17/14 1951     Chief Complaint  Patient presents with  . Chest Pain     (Consider location/radiation/quality/duration/timing/severity/associated sxs/prior Treatment) Patient is a 49 y.o. female presenting with chest pain. The history is provided by the patient. No language interpreter was used.  Chest Pain Pain location:  L chest Pain quality: aching and dull   Pain radiates to:  Upper back Pain radiates to the back: yes   Pain severity:  Moderate Onset quality:  Unable to specify Duration:  2 days Timing:  Intermittent (but now constant since 9pm last night) Progression:  Unchanged Chronicity:  New Context: at rest   Relieved by:  Leaning forward Exacerbated by: laying down. Ineffective treatments:  None tried Associated symptoms: lower extremity edema (chronic unchanged)   Associated symptoms: no abdominal pain, no anorexia, no anxiety, no back pain, no claudication, no cough, no diaphoresis, no fatigue, no fever, no headache, no nausea, no numbness, no palpitations, no shortness of breath, no syncope, not vomiting and no weakness   Risk factors: no aortic disease, no birth control, no coronary artery disease, no diabetes mellitus, no high cholesterol, no hypertension and no prior DVT/PE     Past Medical History  Diagnosis Date  . Asthma   . Blood clot in abdominal vein    Past Surgical History  Procedure Laterality Date  . Tubal ligation    . Blood clot      After a pregnancy 26 years ago   Family History  Problem Relation Age of Onset  . Hypertension Father    History  Substance Use Topics  . Smoking status: Former Smoker -- 0.30 packs/day for 20 years    Types: Cigarettes    Quit date: 02/24/2011  . Smokeless tobacco: Not on file  . Alcohol Use: Yes     Comment: occasionally   OB History    No data available     Review of Systems  Constitutional:  Negative for fever, chills, diaphoresis, activity change, appetite change and fatigue.  HENT: Negative for congestion, facial swelling, rhinorrhea and sore throat.   Eyes: Negative for photophobia and discharge.  Respiratory: Negative for cough, chest tightness and shortness of breath.   Cardiovascular: Positive for chest pain. Negative for palpitations, claudication, leg swelling and syncope.  Gastrointestinal: Negative for nausea, vomiting, abdominal pain, diarrhea and anorexia.  Endocrine: Negative for polydipsia and polyuria.  Genitourinary: Negative for dysuria, frequency, difficulty urinating and pelvic pain.  Musculoskeletal: Negative for back pain, arthralgias, neck pain and neck stiffness.  Skin: Negative for color change and wound.  Allergic/Immunologic: Negative for immunocompromised state.  Neurological: Negative for facial asymmetry, weakness, numbness and headaches.  Hematological: Does not bruise/bleed easily.  Psychiatric/Behavioral: Negative for confusion and agitation.      Allergies  Penicillins  Home Medications   Prior to Admission medications   Medication Sig Start Date End Date Taking? Authorizing Provider  albuterol (PROVENTIL) (2.5 MG/3ML) 0.083% nebulizer solution Take 2.5 mg by nebulization every 6 (six) hours as needed. Wheezing   Yes Historical Provider, MD  budesonide-formoterol (SYMBICORT) 80-4.5 MCG/ACT inhaler Inhale 2 puffs into the lungs 2 (two) times daily. 08/18/14  Yes Barton Dubois, MD  cetirizine (ZYRTEC) 10 MG tablet Take 10 mg by mouth daily.     Yes Historical Provider, MD  fluticasone (FLONASE) 50 MCG/ACT nasal spray Place 1-2 sprays into both nostrils  daily as needed for allergies or rhinitis.   Yes Historical Provider, MD  furosemide (LASIX) 20 MG tablet Take 20 mg by mouth daily as needed for fluid or edema.    Yes Historical Provider, MD  ibuprofen (ADVIL,MOTRIN) 200 MG tablet Take 800 mg by mouth every 6 (six) hours as needed. Pain   Yes  Historical Provider, MD  montelukast (SINGULAIR) 10 MG tablet Take 10 mg by mouth daily.    Yes Historical Provider, MD  levofloxacin (LEVAQUIN) 750 MG tablet Take 1 tablet (750 mg total) by mouth daily. Patient not taking: Reported on 11/17/2014 08/18/14   Barton Dubois, MD  naproxen (NAPROSYN) 500 MG tablet Take 1 tablet (500 mg total) by mouth 2 (two) times daily. 11/17/14   Ernestina Patches, MD  predniSONE (DELTASONE) 20 MG tablet Take 3 tablets by mouth daily X 2 days; then 2 tablets by mouth daily X 2 days; then 1 tablet by mouth daily X 2 days; then 1/2 tablet by mouth daily X 3 days and stop prednisone Patient not taking: Reported on 11/17/2014 08/18/14   Barton Dubois, MD   BP 138/76 mmHg  Pulse 73  Temp(Src) 97.5 F (36.4 C) (Oral)  Resp 16  SpO2 99% Physical Exam  Constitutional: She is oriented to person, place, and time. She appears well-developed and well-nourished. No distress.  HENT:  Head: Normocephalic and atraumatic.  Mouth/Throat: No oropharyngeal exudate.  Eyes: Pupils are equal, round, and reactive to light.  Neck: Normal range of motion. Neck supple.  Cardiovascular: Normal rate, regular rhythm and normal heart sounds.  Exam reveals no gallop and no friction rub.   No murmur heard. Pulmonary/Chest: Effort normal and breath sounds normal. No respiratory distress. She has no wheezes. She has no rales.  Abdominal: Soft. Bowel sounds are normal. She exhibits no distension and no mass. There is no tenderness. There is no rebound and no guarding.  Musculoskeletal: Normal range of motion. She exhibits edema (2+ BLLE). She exhibits no tenderness.  Neurological: She is alert and oriented to person, place, and time.  Skin: Skin is warm and dry.  Psychiatric: She has a normal mood and affect.    ED Course  Procedures (including critical care time) Labs Review Labs Reviewed  BASIC METABOLIC PANEL - Abnormal; Notable for the following:    Potassium 3.1 (*)    Glucose, Bld  100 (*)    All other components within normal limits  CBC  I-STAT TROPOININ, ED    Imaging Review Dg Chest 2 View  11/17/2014   CLINICAL DATA:  LEFT chest pain since yesterday, history asthma, former smoker  EXAM: CHEST  2 VIEW  COMPARISON:  09/16/2014  FINDINGS: Enlargement of cardiac silhouette with pulmonary vascular congestion.  Mediastinal contours normal.  Peribronchial thickening, chronic.  Minimal bibasilar atelectasis.  No definite infiltrate, pleural effusion or pneumothorax.  Bones unremarkable.  IMPRESSION: Bronchitic changes with minimal bibasilar atelectasis.   Electronically Signed   By: Lavonia Dana M.D.   On: 11/17/2014 21:43     EKG Interpretation   Date/Time:  Monday November 17 2014 19:11:50 EST Ventricular Rate:  78 PR Interval:  175 QRS Duration: 112 QT Interval:  411 QTC Calculation: 468 R Axis:   62 Text Interpretation:  Sinus rhythm Incomplete right bundle branch block  Baseline wander in lead(s) V6 No significant change since last tracing  Confirmed by DOCHERTY  MD, Lorimor ([id number]) on 11/17/2014 11:18:35 PM      MDM   Final diagnoses:  Atypical chest pain    Pt is a 49 y.o. female with Pmhx as above who presents with chest pain since yesterday.  Patient reports pain has been constant since around 9 PM last night is dull, achy pain in the chest and is also having a sharp achy pain in the left side of her back.  She has no associated shortness of breath, nausea, vomiting, diaphoresis.  She has bilateral lower extremity edema without pain.  It is a chronic issue.  On physical exam, vital signs are stable and she is in no acute distress. No acute ischemic changes, CXR w/ chronic bronchitic changes. Trop is neg which is reassuring given pain constant since last night. On PE, pain is reproducible, and I think pain is likely musculoskeletal.  Patient will be started on scheduled NSAIDs and I will ask her to follow up closely with her PCP.    Jamella Reuter evaluation  in the Emergency Department is complete. It has been determined that no acute conditions requiring further emergency intervention are present at this time. The patient/guardian have been advised of the diagnosis and plan. We have discussed signs and symptoms that warrant return to the ED, such as changes or worsening in symptoms, worsening pain, development of fever, shortness of breath, nausea or diaphoresis.    Ernestina Patches, MD 11/17/14 [phone]

## 2014-11-17 NOTE — ED Notes (Signed)
Bed: WA10 Expected date:  Expected time:  Means of arrival:  Comments: Triage 2 

## 2014-11-17 NOTE — Discharge Instructions (Signed)

## 2015-08-26 IMAGING — CR DG CHEST 2V
2 series · 2 of 2 positions shown · non-contrast
Comparison: 09/16/2014

CLINICAL DATA: LEFT chest pain since yesterday, history asthma,
former smoker

EXAM:
CHEST  2 VIEW

[w chest pa]
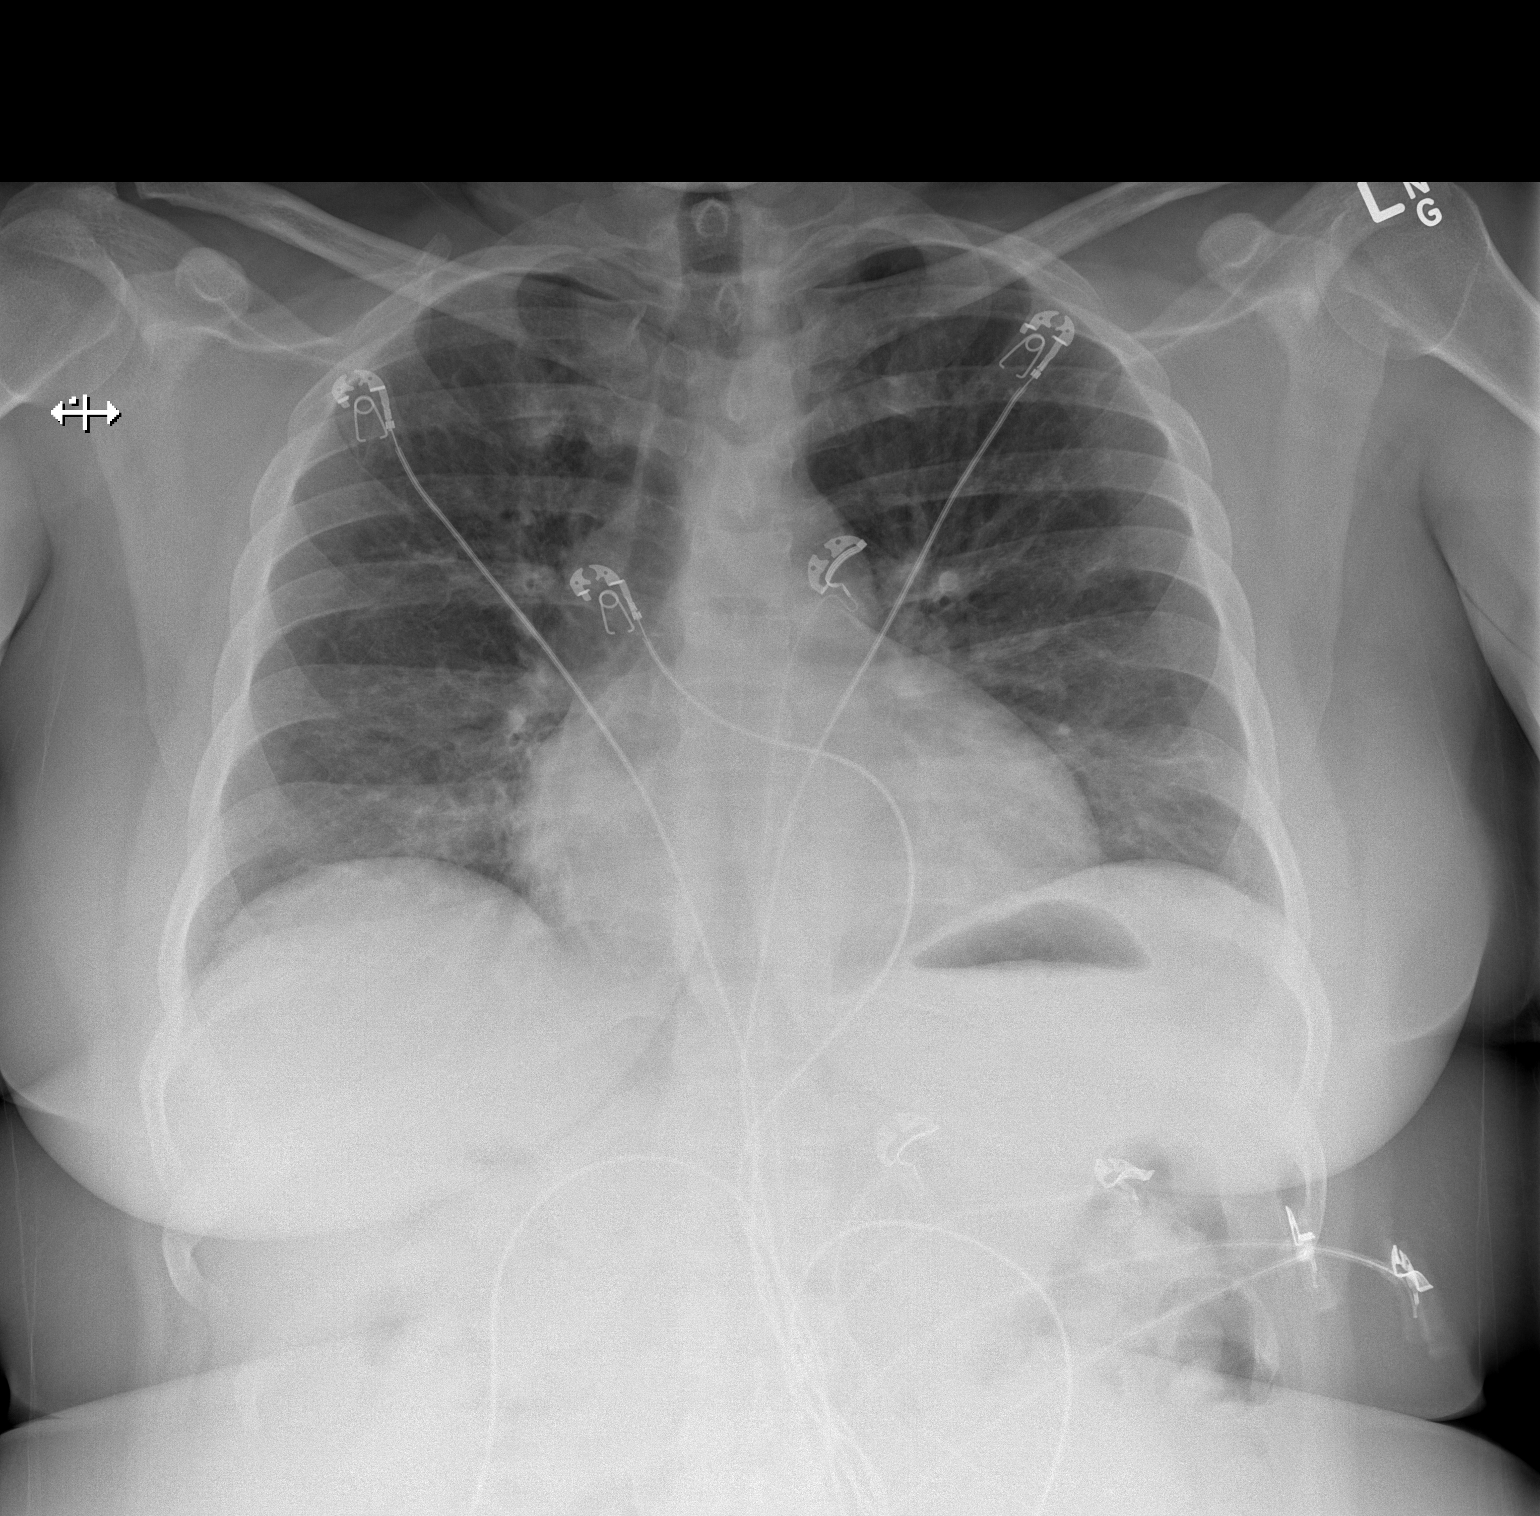

[w chest lat]
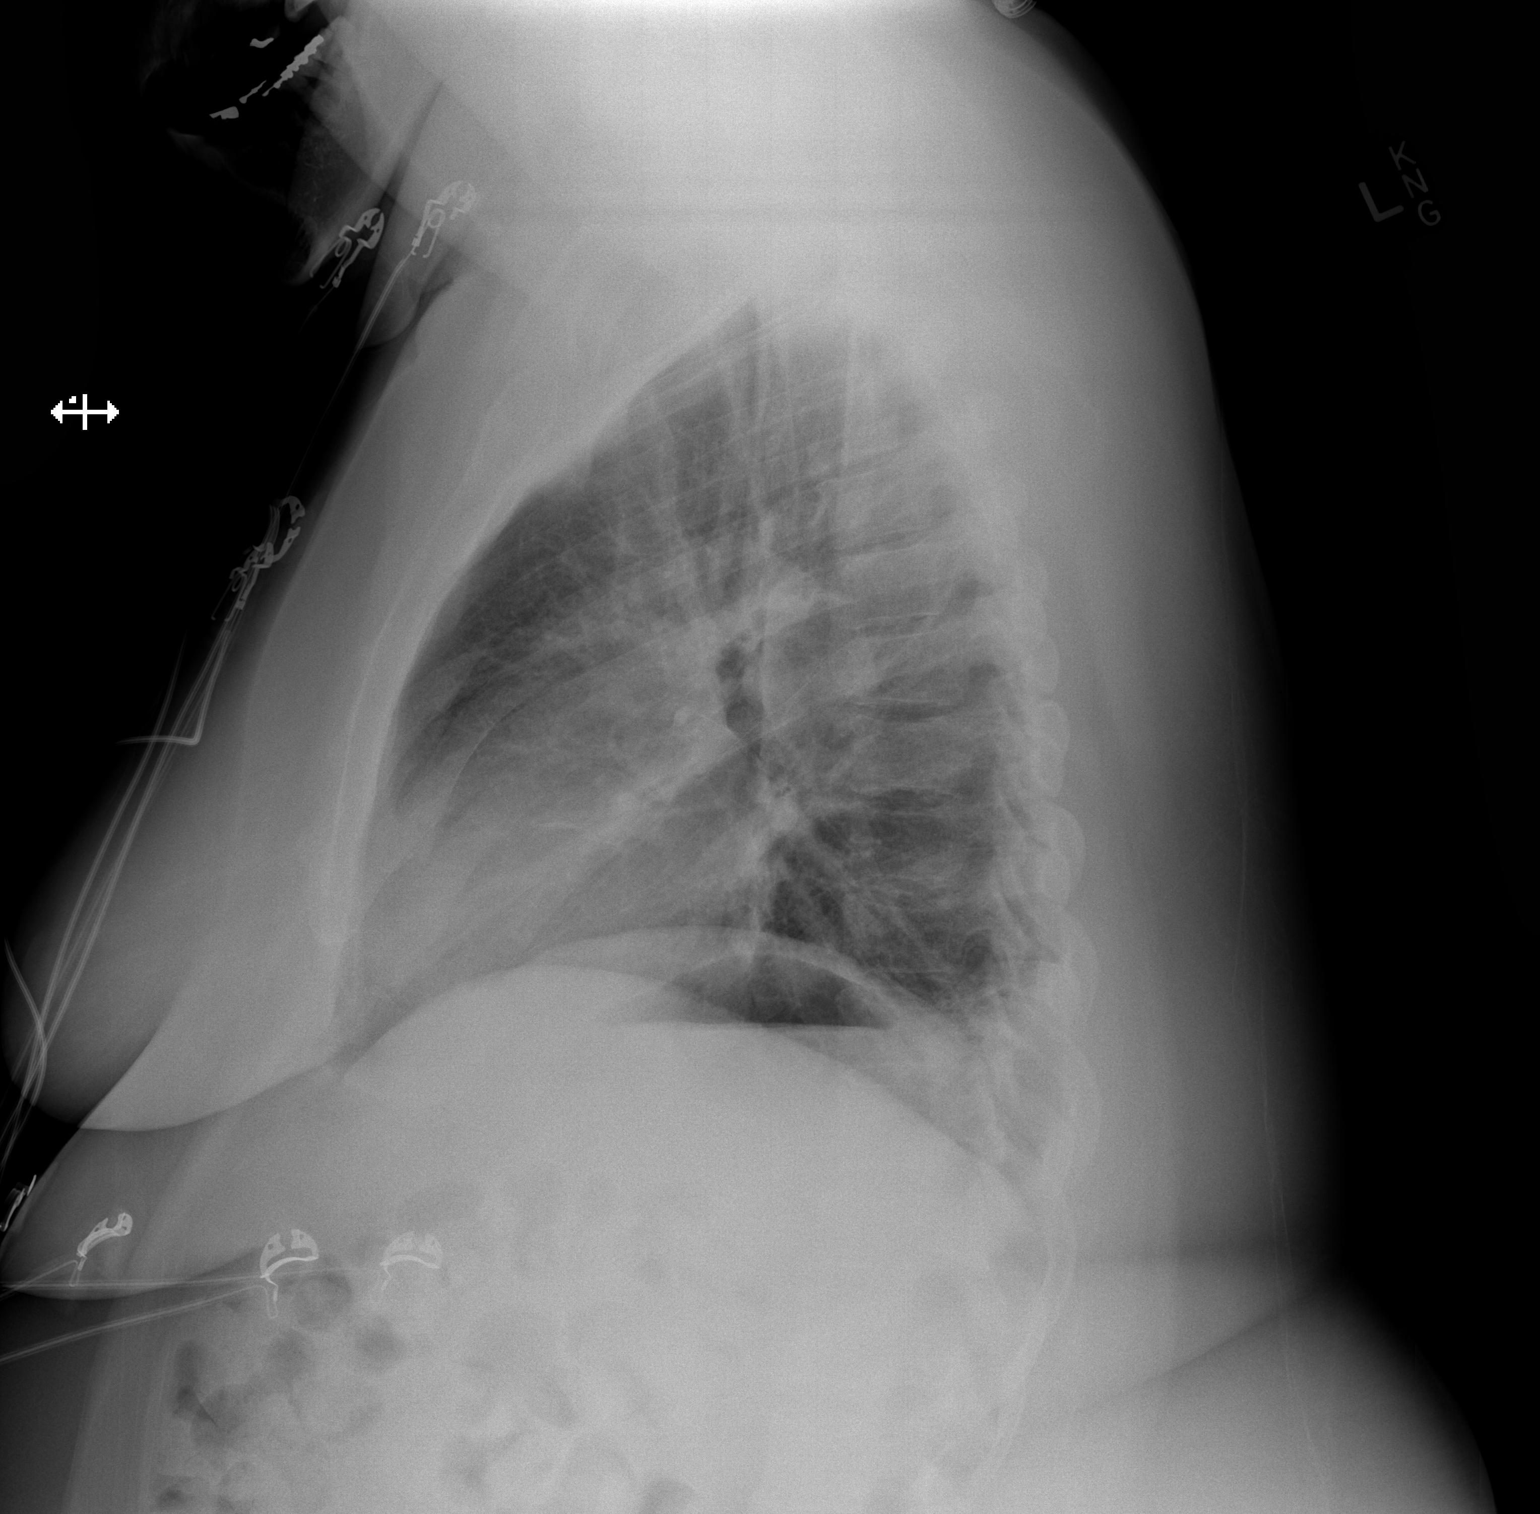

[2 of 2 positions shown; findings below may reference images not displayed]

FINDINGS: Enlargement of cardiac silhouette with pulmonary vascular
congestion.

Mediastinal contours normal.

Peribronchial thickening, chronic.

Minimal bibasilar atelectasis.

No definite infiltrate, pleural effusion or pneumothorax.

Bones unremarkable.
IMPRESSION: Bronchitic changes with minimal bibasilar atelectasis.

## 2015-10-29 ENCOUNTER — Emergency Department (HOSPITAL_COMMUNITY)
Admission: EM | Admit: 2015-10-29 | Discharge: 2015-10-29 | Disposition: A | Discharge status: Home / Self Care | Payer: BLUE CROSS/BLUE SHIELD | Attending: Emergency Medicine | Admitting: Emergency Medicine

## 2015-10-29 ENCOUNTER — Encounter (HOSPITAL_COMMUNITY): Payer: Self-pay | Admitting: Emergency Medicine

## 2015-10-29 DIAGNOSIS — R42 Dizziness and giddiness: Secondary | ICD-10-CM | POA: Diagnosis present

## 2015-10-29 DIAGNOSIS — H5509 Other forms of nystagmus: Secondary | ICD-10-CM | POA: Insufficient documentation

## 2015-10-29 DIAGNOSIS — Z880 Allergy status to penicillin: Secondary | ICD-10-CM | POA: Diagnosis not present

## 2015-10-29 DIAGNOSIS — Z8679 Personal history of other diseases of the circulatory system: Secondary | ICD-10-CM | POA: Insufficient documentation

## 2015-10-29 DIAGNOSIS — N390 Urinary tract infection, site not specified: Secondary | ICD-10-CM | POA: Insufficient documentation

## 2015-10-29 DIAGNOSIS — Z79899 Other long term (current) drug therapy: Secondary | ICD-10-CM | POA: Diagnosis not present

## 2015-10-29 DIAGNOSIS — R51 Headache: Secondary | ICD-10-CM | POA: Diagnosis not present

## 2015-10-29 DIAGNOSIS — J45909 Unspecified asthma, uncomplicated: Secondary | ICD-10-CM | POA: Diagnosis not present

## 2015-10-29 DIAGNOSIS — J3489 Other specified disorders of nose and nasal sinuses: Secondary | ICD-10-CM | POA: Insufficient documentation

## 2015-10-29 DIAGNOSIS — R110 Nausea: Secondary | ICD-10-CM | POA: Insufficient documentation

## 2015-10-29 DIAGNOSIS — Z87891 Personal history of nicotine dependence: Secondary | ICD-10-CM | POA: Insufficient documentation

## 2015-10-29 LAB — URINALYSIS, ROUTINE W REFLEX MICROSCOPIC
Bilirubin Urine: NEGATIVE
GLUCOSE, UA: NEGATIVE mg/dL
Hgb urine dipstick: NEGATIVE
Ketones, ur: NEGATIVE mg/dL
Nitrite: NEGATIVE
Protein, ur: NEGATIVE mg/dL
SPECIFIC GRAVITY, URINE: 1.008 (ref 1.005–1.030)
pH: 7.5 (ref 5.0–8.0)

## 2015-10-29 LAB — BASIC METABOLIC PANEL
Anion gap: 7 (ref 5–15)
BUN: 13 mg/dL (ref 6–20)
CHLORIDE: 106 mmol/L (ref 101–111)
CO2: 26 mmol/L (ref 22–32)
Calcium: 9.2 mg/dL (ref 8.9–10.3)
Creatinine, Ser: 0.77 mg/dL (ref 0.44–1.00)
GFR calc non Af Amer: >60 mL/min (ref >60)
Glucose, Bld: 96 mg/dL (ref 65–99)
Potassium: 4.2 mmol/L (ref 3.5–5.1)
SODIUM: 139 mmol/L (ref 135–145)

## 2015-10-29 LAB — URINE MICROSCOPIC-ADD ON

## 2015-10-29 LAB — CBG MONITORING, ED: Glucose-Capillary: 102 mg/dL — ABNORMAL HIGH (ref 65–99)

## 2015-10-29 LAB — CBC
HCT: 39.5 % (ref 36.0–46.0)
Hemoglobin: 13.6 g/dL (ref 12.0–15.0)
MCH: 27.6 pg (ref 26.0–34.0)
MCHC: 34.4 g/dL (ref 30.0–36.0)
MCV: 80.3 fL (ref 78.0–100.0)
Platelets: 232 10*3/uL (ref 150–400)
RBC: 4.92 MIL/uL (ref 3.87–5.11)
RDW: 14.3 % (ref 11.5–15.5)
WBC: 6.3 10*3/uL (ref 4.0–10.5)

## 2015-10-29 MED ORDER — SULFAMETHOXAZOLE-TRIMETHOPRIM 800-160 MG PO TABS: 1.0000 | ORAL_TABLET | Freq: Two times a day (BID) | ORAL | Status: DC

## 2015-10-29 MED ORDER — MECLIZINE HCL 25 MG PO TABS: 25.0000 mg | ORAL_TABLET | Freq: Three times a day (TID) | ORAL | Status: DC | PRN

## 2015-10-29 MED ORDER — ONDANSETRON 4 MG PO TBDP
4.0000 mg | ORAL_TABLET | Freq: Once | ORAL | Status: AC
  Administered 2015-10-29: 4 mg via ORAL
  Filled 2015-10-29: qty 1

## 2015-10-29 MED ORDER — MECLIZINE HCL 25 MG PO TABS
25.0000 mg | ORAL_TABLET | Freq: Once | ORAL | Status: AC
  Administered 2015-10-29: 25 mg via ORAL
  Filled 2015-10-29: qty 1

## 2015-10-29 MED ORDER — ONDANSETRON HCL 4 MG/2ML IJ SOLN: 4.0000 mg | Freq: Once | INTRAMUSCULAR | Status: DC

## 2015-10-29 NOTE — Discharge Instructions (Signed)
Urinary Tract Infection Urinary tract infections (UTIs) can develop anywhere along your urinary tract. Your urinary tract is your body's drainage system for removing wastes and extra water. Your urinary tract includes two kidneys, two ureters, a bladder, and a urethra. Your kidneys are a pair of bean-shaped organs. Each kidney is about the size of your fist. They are located below your ribs, one on each side of your spine. CAUSES Infections are caused by microbes, which are microscopic organisms, including fungi, viruses, and bacteria. These organisms are so small that they can only be seen through a microscope. Bacteria are the microbes that most commonly cause UTIs. SYMPTOMS  Symptoms of UTIs may vary by age and gender of the patient and by the location of the infection. Symptoms in young women typically include a frequent and intense urge to urinate and a painful, burning feeling in the bladder or urethra during urination. Older women and men are more likely to be tired, shaky, and weak and have muscle aches and abdominal pain. A fever may mean the infection is in your kidneys. Other symptoms of a kidney infection include pain in your back or sides below the ribs, nausea, and vomiting. DIAGNOSIS To diagnose a UTI, your caregiver will ask you about your symptoms. Your caregiver will also ask you to provide a urine sample. The urine sample will be tested for bacteria and white blood cells. White blood cells are made by your body to help fight infection. TREATMENT  Typically, UTIs can be treated with medication. Because most UTIs are caused by a bacterial infection, they usually can be treated with the use of antibiotics. The choice of antibiotic and length of treatment depend on your symptoms and the type of bacteria causing your infection. HOME CARE INSTRUCTIONS  If you were prescribed antibiotics, take them exactly as your caregiver instructs you. Finish the medication even if you feel better after  you have only taken some of the medication.  Drink enough water and fluids to keep your urine clear or pale yellow.  Avoid caffeine, tea, and carbonated beverages. They tend to irritate your bladder.  Empty your bladder often. Avoid holding urine for long periods of time.  Empty your bladder before and after sexual intercourse.  After a bowel movement, women should cleanse from front to back. Use each tissue only once. SEEK MEDICAL CARE IF:   You have back pain.  You develop a fever.  Your symptoms do not begin to resolve within 3 days. SEEK IMMEDIATE MEDICAL CARE IF:   You have severe back pain or lower abdominal pain.  You develop chills.  You have nausea or vomiting.  You have continued burning or discomfort with urination. MAKE SURE YOU:   Understand these instructions.  Will watch your condition.  Will get help right away if you are not doing well or get worse.   This information is not intended to replace advice given to you by your health care provider. Make sure you discuss any questions you have with your health care provider.   Document Released: 06/29/2005 Document Revised: 06/10/2015 Document Reviewed: 10/28/2011 Elsevier Interactive Patient Education 2016 [employer].  Vertigo Vertigo means you feel like you or your surroundings are moving when they are not. Vertigo can be dangerous if it occurs when you are at work, driving, or performing difficult activities.  CAUSES  Vertigo occurs when there is a conflict of signals sent to your brain from the visual and sensory systems in your body. There are  many different causes of vertigo, including:  Infections, especially in the inner ear.  A bad reaction to a drug or misuse of alcohol and medicines.  Withdrawal from drugs or alcohol.  Rapidly changing positions, such as lying down or rolling over in bed.  A migraine headache.  Decreased blood flow to the brain.  Increased pressure in the brain from a  head injury, infection, tumor, or bleeding. SYMPTOMS  You may feel as though the world is spinning around or you are falling to the ground. Because your balance is upset, vertigo can cause nausea and vomiting. You may have involuntary eye movements (nystagmus). DIAGNOSIS  Vertigo is usually diagnosed by physical exam. If the cause of your vertigo is unknown, your caregiver may perform imaging tests, such as an MRI scan (magnetic resonance imaging). TREATMENT  Most cases of vertigo resolve on their own, without treatment. Depending on the cause, your caregiver may prescribe certain medicines. If your vertigo is related to body position issues, your caregiver may recommend movements or procedures to correct the problem. In rare cases, if your vertigo is caused by certain inner ear problems, you may need surgery. HOME CARE INSTRUCTIONS   Follow your caregiver's instructions.  Avoid driving.  Avoid operating heavy machinery.  Avoid performing any tasks that would be dangerous to you or others during a vertigo episode.  Tell your caregiver if you notice that certain medicines seem to be causing your vertigo. Some of the medicines used to treat vertigo episodes can actually make them worse in some people. SEEK IMMEDIATE MEDICAL CARE IF:   Your medicines do not relieve your vertigo or are making it worse.  You develop problems with talking, walking, weakness, or using your arms, hands, or legs.  You develop severe headaches.  Your nausea or vomiting continues or gets worse.  You develop visual changes.  A family member notices behavioral changes.  Your condition gets worse. MAKE SURE YOU:  Understand these instructions.  Will watch your condition.  Will get help right away if you are not doing well or get worse.   This information is not intended to replace advice given to you by your health care provider. Make sure you discuss any questions you have with your health care  provider.   Document Released: 06/29/2005 Document Revised: 12/12/2011 Document Reviewed: 01/12/2015 Elsevier Interactive Patient Education [employer].

## 2015-10-29 NOTE — ED Notes (Signed)
Per pt, states she woke up dizzy this am-states slight headache

## 2015-10-29 NOTE — ED Provider Notes (Signed)
CSN: [id number]     Arrival date & time 10/29/15  0945 History   First MD Initiated Contact with Patient 10/29/15 1047     Chief Complaint  Patient presents with  . Dizziness      Patient is a 50 y.o. female presenting with dizziness. The history is provided by the patient.  Dizziness Associated symptoms: headaches and nausea   Associated symptoms: no chest pain, no hearing loss, no shortness of breath and no tinnitus    patient presents with dizziness. States she woke this morning with a feeling that the room was spinning. Slight headache. States she became nauseous. Worse with movement. No chest pain. No trouble breathing. No confusion. States if she moves around she has to take it slowly.  Past Medical History  Diagnosis Date  . Asthma   . Blood clot in abdominal vein    Past Surgical History  Procedure Laterality Date  . Tubal ligation    . Blood clot      After a pregnancy 26 years ago   Family History  Problem Relation Age of Onset  . Hypertension Father    Social History  Substance Use Topics  . Smoking status: Former Smoker -- 0.30 packs/day for 20 years    Types: Cigarettes    Quit date: 02/24/2011  . Smokeless tobacco: None  . Alcohol Use: Yes     Comment: occasionally   OB History    No data available     Review of Systems  Constitutional: Negative for appetite change.  HENT: Positive for sinus pressure. Negative for ear discharge, facial swelling, hearing loss and tinnitus.   Respiratory: Negative for choking and shortness of breath.   Cardiovascular: Negative for chest pain.  Gastrointestinal: Positive for nausea. Negative for abdominal pain.  Genitourinary: Negative for flank pain and enuresis.  Musculoskeletal: Negative for back pain.  Skin: Negative for wound.  Neurological: Positive for dizziness and headaches. Negative for tremors and syncope.  Hematological: Negative for adenopathy.  Psychiatric/Behavioral: Negative for agitation.       Allergies  Meloxicam; Methocarbamol; and Penicillins  Home Medications   Prior to Admission medications   Medication Sig Start Date End Date Taking? Authorizing Provider  albuterol (PROVENTIL) (2.5 MG/3ML) 0.083% nebulizer solution Take 2.5 mg by nebulization every 6 (six) hours as needed. Wheezing   Yes Historical Provider, MD  budesonide-formoterol (SYMBICORT) 80-4.5 MCG/ACT inhaler Inhale 2 puffs into the lungs 2 (two) times daily. Patient taking differently: Inhale 2 puffs into the lungs 2 (two) times daily as needed.  08/18/14  Yes Barton Dubois, MD  CALCIUM PO Take by mouth daily.   Yes Historical Provider, MD  CYANOCOBALAMIN PO Take by mouth daily.   Yes Historical Provider, MD  furosemide (LASIX) 20 MG tablet Take 20 mg by mouth daily as needed for fluid or edema.    Yes Historical Provider, MD  montelukast (SINGULAIR) 10 MG tablet Take 10 mg by mouth daily.    Yes Historical Provider, MD  Multiple Vitamin (MULTIVITAMIN WITH MINERALS) TABS tablet Take 1 tablet by mouth daily.   Yes Historical Provider, MD  meclizine (ANTIVERT) 25 MG tablet Take 1 tablet (25 mg total) by mouth 3 (three) times daily as needed for dizziness. 10/29/15   Davonna Belling, MD  sulfamethoxazole-trimethoprim (BACTRIM DS,SEPTRA DS) 800-160 MG tablet Take 1 tablet by mouth 2 (two) times daily. 10/29/15   Davonna Belling, MD   BP 122/77 mmHg  Pulse 70  Temp(Src) 98.2 F (36.8 C) (Oral)  Resp 14  SpO2 100% Physical Exam  Constitutional: She appears well-developed.  HENT:  Head: Atraumatic.  Eyes: EOM are normal. Pupils are equal, round, and reactive to light.  Nystagmus with movement of gaze.  Neck: Neck supple.  Cardiovascular: Normal rate.   Pulmonary/Chest: Effort normal.  Abdominal: Soft. There is no tenderness.  Musculoskeletal: Normal range of motion.  Neurological: She is alert.  Skin: Skin is warm.    ED Course  Procedures (including critical care time) Labs Review Labs Reviewed   URINALYSIS, ROUTINE W REFLEX MICROSCOPIC (NOT AT [facility]) - Abnormal; Notable for the following:    APPearance CLOUDY (*)    Leukocytes, UA MODERATE (*)    All other components within normal limits  URINE MICROSCOPIC-ADD ON - Abnormal; Notable for the following:    Squamous Epithelial / LPF 0-5 (*)    Bacteria, UA FEW (*)    All other components within normal limits  CBG MONITORING, ED - Abnormal; Notable for the following:    Glucose-Capillary 102 (*)    All other components within normal limits  URINE CULTURE  BASIC METABOLIC PANEL  CBC    Imaging Review No results found. I have personally reviewed and evaluated these images and lab results as part of my medical decision-making.   EKG Interpretation None      MDM   Final diagnoses:  Vertigo  Acute lower UTI    Patient presents with vertigo. She feels the room spinning around. Mild nystagmus improved with Antivert. She feels better. Nausea is improved. Laboratory reassuring some possible UTI. With pressure question patient states she does have some right flank pain. No fevers. We'll treat as UTI and cultures been sent. Doubt central vertigo at this time although patient was given instructions. She will be discharged home.    Davonna Belling, MD 10/29/15 [phone]

## 2015-10-29 NOTE — ED Notes (Signed)
Bed: [id number] Expected date:  Expected time:  Means of arrival:  Comments: Hold for triage 5

## 2015-10-30 LAB — URINE CULTURE

## 2016-09-26 ENCOUNTER — Encounter (HOSPITAL_COMMUNITY): Payer: Self-pay | Admitting: Emergency Medicine

## 2016-09-26 ENCOUNTER — Emergency Department (HOSPITAL_COMMUNITY)
Admission: EM | Admit: 2016-09-26 | Discharge: 2016-09-26 | Disposition: A | Discharge status: Home / Self Care | Payer: BLUE CROSS/BLUE SHIELD | Attending: Emergency Medicine | Admitting: Emergency Medicine

## 2016-09-26 DIAGNOSIS — R200 Anesthesia of skin: Secondary | ICD-10-CM | POA: Insufficient documentation

## 2016-09-26 DIAGNOSIS — I10 Essential (primary) hypertension: Secondary | ICD-10-CM | POA: Insufficient documentation

## 2016-09-26 DIAGNOSIS — J45909 Unspecified asthma, uncomplicated: Secondary | ICD-10-CM | POA: Insufficient documentation

## 2016-09-26 DIAGNOSIS — Z87891 Personal history of nicotine dependence: Secondary | ICD-10-CM | POA: Insufficient documentation

## 2016-09-26 LAB — BASIC METABOLIC PANEL
ANION GAP: 7 (ref 5–15)
BUN: 15 mg/dL (ref 6–20)
CHLORIDE: 106 mmol/L (ref 101–111)
CO2: 27 mmol/L (ref 22–32)
CREATININE: 0.88 mg/dL (ref 0.44–1.00)
Calcium: 8.7 mg/dL — ABNORMAL LOW (ref 8.9–10.3)
GFR calc non Af Amer: >60 mL/min (ref >60)
Glucose, Bld: 84 mg/dL (ref 65–99)
POTASSIUM: 3.4 mmol/L — AB (ref 3.5–5.1)
SODIUM: 140 mmol/L (ref 135–145)

## 2016-09-26 LAB — CBC WITH DIFFERENTIAL/PLATELET
BASOS ABS: 0 10*3/uL (ref 0.0–0.1)
BASOS PCT: 0 %
EOS ABS: 0.2 10*3/uL (ref 0.0–0.7)
Eosinophils Relative: 2 %
HEMATOCRIT: 36.2 % (ref 36.0–46.0)
HEMOGLOBIN: 13 g/dL (ref 12.0–15.0)
Lymphocytes Relative: 17 %
Lymphs Abs: 1.4 10*3/uL (ref 0.7–4.0)
MCH: 27.8 pg (ref 26.0–34.0)
MCHC: 35.9 g/dL (ref 30.0–36.0)
MCV: 77.5 fL — ABNORMAL LOW (ref 78.0–100.0)
MONOS PCT: 6 %
Monocytes Absolute: 0.5 10*3/uL (ref 0.1–1.0)
NEUTROS ABS: 6.2 10*3/uL (ref 1.7–7.7)
NEUTROS PCT: 75 %
Platelets: 236 10*3/uL (ref 150–400)
RBC: 4.67 MIL/uL (ref 3.87–5.11)
RDW: 15 % (ref 11.5–15.5)
WBC: 8.3 10*3/uL (ref 4.0–10.5)

## 2016-09-26 MED ORDER — POTASSIUM CHLORIDE CRYS ER 20 MEQ PO TBCR
20.0000 meq | EXTENDED_RELEASE_TABLET | Freq: Once | ORAL | Status: AC
  Administered 2016-09-26: 20 meq via ORAL
  Filled 2016-09-26: qty 1

## 2016-09-26 NOTE — ED Provider Notes (Signed)
[place] DEPT Provider Note   CSN: [id number] Arrival date & time: 09/26/16  [id number]     History   Chief Complaint Chief Complaint  Patient presents with  . Numbness    HPI Christina Robles is a 50 y.o. female.  Right cheek numbness since 6:15 AM this morning. No motor or sensory deficits, mental status changes, slurred speech, facial drooping. No eyelid or tongue involvement. Patient has not been ill lately. Severity of symptoms is mild. Nothing makes symptoms better or worse.      Past Medical History:  Diagnosis Date  . Asthma   . 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 SOB (shortness of breath)   . Morbid obesity ([place])   . Asthma exacerbation 08/16/2014  . Acute respiratory failure with hypoxia ([place]) 08/16/2014  . Hypokalemia 08/16/2014  . Leukocytosis 08/16/2014  . CAP (community acquired pneumonia)   . HTN (hypertension) 09/26/2011    Past Surgical History:  Procedure Laterality Date  . Blood clot     After a pregnancy 26 years ago  . TUBAL LIGATION      OB History    No data available       Home Medications    Prior to Admission medications   Medication Sig Start Date End Date Taking? Authorizing Provider  budesonide-formoterol (SYMBICORT) 80-4.5 MCG/ACT inhaler Inhale 2 puffs into the lungs 2 (two) times daily. Patient taking differently: Inhale 2 puffs into the lungs 2 (two) times daily as needed.  08/18/14  Yes Barton Dubois, MD  cetirizine (ZYRTEC) 10 MG tablet Take 10 mg by mouth at bedtime.   Yes Historical Provider, MD  CYANOCOBALAMIN PO Take by mouth daily.   Yes Historical Provider, MD  furosemide (LASIX) 20 MG tablet Take 20 mg by mouth daily as needed for fluid or edema.    Yes Historical Provider, MD  montelukast (SINGULAIR) 10 MG tablet Take 10 mg by mouth daily.    Yes Historical Provider, MD  Multiple Vitamin (MULTIVITAMIN WITH MINERALS) TABS tablet Take 1 tablet by mouth daily.    Yes Historical Provider, MD  Omega-3 Fatty Acids (FISH OIL) 1000 MG CAPS Take 1 capsule by mouth daily.   Yes Historical Provider, MD  albuterol (PROVENTIL) (2.5 MG/3ML) 0.083% nebulizer solution Take 2.5 mg by nebulization every 6 (six) hours as needed. Wheezing    Historical Provider, MD  CALCIUM PO Take by mouth daily.    Historical Provider, MD  meclizine (ANTIVERT) 25 MG tablet Take 1 tablet (25 mg total) by mouth 3 (three) times daily as needed for dizziness. Patient not taking: Reported on 09/26/2016 10/29/15   Davonna Belling, MD  sulfamethoxazole-trimethoprim (BACTRIM DS,SEPTRA DS) 800-160 MG tablet Take 1 tablet by mouth 2 (two) times daily. Patient not taking: Reported on 09/26/2016 10/29/15   Davonna Belling, MD    Family History Family History  Problem Relation Age of Onset  . Hypertension Father     Social History Social History  Substance Use Topics  . Smoking status: Former Smoker    Packs/day: 0.30    Years: 20.00    Types: Cigarettes    Quit date: 02/24/2011  . Smokeless tobacco: Never Used  . Alcohol use Yes     Comment: occasionally     Allergies   Meloxicam; Methocarbamol; and Penicillins   Review of Systems Review of Systems  All other systems reviewed and are negative.    Physical Exam Updated  Vital Signs BP 111/70 (BP Location: Left Arm)   Pulse 81   Temp 97.8 F (36.6 C) (Oral)   Resp 17   Ht 5\' 2"  (1.575 m)   Wt 250 lb (113.4 kg)   SpO2 95%   BMI 45.73 kg/m   Physical Exam  Constitutional: She is oriented to person, place, and time. She appears well-developed and well-nourished.  HENT:  Head: Normocephalic and atraumatic.  Eyes: Conjunctivae are normal.  Neck: Neck supple.  Cardiovascular: Normal rate and regular rhythm.   Pulmonary/Chest: Effort normal and breath sounds normal.  Abdominal: Soft. Bowel sounds are normal.  Musculoskeletal: Normal range of motion.  Neurological: She is alert and oriented to person, place, and time.   Subjective right cheek numbness. Otherwise normal neurological exam  Skin: Skin is warm and dry.  Psychiatric: She has a normal mood and affect. Her behavior is normal.  Nursing note and vitals reviewed.    ED Treatments / Results  Labs (all labs ordered are listed, but only abnormal results are displayed) Labs Reviewed  CBC WITH DIFFERENTIAL/PLATELET - Abnormal; Notable for the following:       Result Value   MCV 77.5 (*)    All other components within normal limits  BASIC METABOLIC PANEL - Abnormal; Notable for the following:    Potassium 3.4 (*)    Calcium 8.7 (*)    All other components within normal limits    EKG  EKG Interpretation None       Radiology No results found.  Procedures Procedures (including critical care time)  Medications Ordered in ED Medications  potassium chloride SA (K-DUR,KLOR-CON) CR tablet 20 mEq (20 mEq Oral Given 09/26/16 0913)     Initial Impression / Assessment and Plan / ED Course  I have reviewed the triage vital signs and the nursing notes.  Pertinent labs & imaging results that were available during my care of the patient were reviewed by me and considered in my medical decision making (see chart for details).  Clinical Course     Patient is neurologically stable. This could be the beginnings of Bell's palsy, but no firm evidence at this point.  Final Clinical Impressions(s) / ED Diagnoses   Final diagnoses:  Right facial numbness    New Prescriptions New Prescriptions   No medications on file     Nat Christen, MD 09/26/16 [phone]

## 2016-09-26 NOTE — ED Notes (Signed)
Patient is A & O x4.  She understood discharge instructions. 

## 2016-09-26 NOTE — Discharge Instructions (Signed)
Blood work was good. Follow-up your primary care doctor.

## 2016-09-26 NOTE — ED Triage Notes (Signed)
Patient complains of right side of face being numb and tingling. Patient states it started 30 minutes ago.

## 2016-11-01 ENCOUNTER — Inpatient Hospital Stay (HOSPITAL_COMMUNITY): Admission: RE | Admit: 2016-11-01 | Payer: Self-pay | Source: Ambulatory Visit

## 2016-11-01 ENCOUNTER — Emergency Department (HOSPITAL_COMMUNITY)
Admission: EM | Admit: 2016-11-01 | Discharge: 2016-11-01 | Disposition: A | Discharge status: Home / Self Care | Payer: BLUE CROSS/BLUE SHIELD | Attending: Emergency Medicine | Admitting: Emergency Medicine

## 2016-11-01 DIAGNOSIS — L03116 Cellulitis of left lower limb: Secondary | ICD-10-CM | POA: Insufficient documentation

## 2016-11-01 DIAGNOSIS — J45909 Unspecified asthma, uncomplicated: Secondary | ICD-10-CM | POA: Insufficient documentation

## 2016-11-01 DIAGNOSIS — Z87891 Personal history of nicotine dependence: Secondary | ICD-10-CM | POA: Insufficient documentation

## 2016-11-01 DIAGNOSIS — I10 Essential (primary) hypertension: Secondary | ICD-10-CM | POA: Insufficient documentation

## 2016-11-01 LAB — CBC WITH DIFFERENTIAL/PLATELET
Basophils Absolute: 0 10*3/uL (ref 0.0–0.1)
Basophils Relative: 0 %
Eosinophils Absolute: 0.4 10*3/uL (ref 0.0–0.7)
Eosinophils Relative: 4 %
HEMATOCRIT: 37.3 % (ref 36.0–46.0)
Hemoglobin: 12.8 g/dL (ref 12.0–15.0)
LYMPHS ABS: 3 10*3/uL (ref 0.7–4.0)
LYMPHS PCT: 30 %
MCH: 26.5 pg (ref 26.0–34.0)
MCHC: 34.3 g/dL (ref 30.0–36.0)
MCV: 77.2 fL — AB (ref 78.0–100.0)
MONO ABS: 0.6 10*3/uL (ref 0.1–1.0)
Monocytes Relative: 6 %
Neutro Abs: 6.1 10*3/uL (ref 1.7–7.7)
Neutrophils Relative %: 60 %
Platelets: 239 10*3/uL (ref 150–400)
RBC: 4.83 MIL/uL (ref 3.87–5.11)
RDW: 14.8 % (ref 11.5–15.5)
WBC: 10.1 10*3/uL (ref 4.0–10.5)

## 2016-11-01 LAB — BASIC METABOLIC PANEL
ANION GAP: 8 (ref 5–15)
BUN: 12 mg/dL (ref 6–20)
CALCIUM: 9.1 mg/dL (ref 8.9–10.3)
CO2: 26 mmol/L (ref 22–32)
CREATININE: 0.85 mg/dL (ref 0.44–1.00)
Chloride: 105 mmol/L (ref 101–111)
Glucose, Bld: 100 mg/dL — ABNORMAL HIGH (ref 65–99)
Potassium: 3.6 mmol/L (ref 3.5–5.1)
SODIUM: 139 mmol/L (ref 135–145)

## 2016-11-01 LAB — D-DIMER, QUANTITATIVE: D-Dimer, Quant: 0.43 ug/mL-FEU (ref 0.00–0.50)

## 2016-11-01 MED ORDER — DOXYCYCLINE HYCLATE 100 MG PO CAPS: 100.0000 mg | ORAL_CAPSULE | Freq: Two times a day (BID) | ORAL | 0 refills | Status: DC

## 2016-11-01 MED ORDER — TRAMADOL HCL 50 MG PO TABS: 50.0000 mg | ORAL_TABLET | Freq: Four times a day (QID) | ORAL | 0 refills | Status: DC | PRN

## 2016-11-01 NOTE — ED Provider Notes (Signed)
[facility] DEPT Provider Note   CSN: [id number] Arrival date & time: 11/01/16  0100  By signing my name below, I, Christina Robles, attest that this documentation has been prepared under the direction and in the presence of Orpah Greek, MD. Electronically Signed: Gwenlyn Robles, ED Scribe. 11/01/16. 4:41 AM.   History   Chief Complaint Chief Complaint  Patient presents with  . Leg Swelling   The history is provided by the patient. No language interpreter was used.   HPI Comments: Christina Robles is a 51 y.o. female who presents to the Emergency Department complaining of gradual onset, constant, moderate right leg pain for 1 week that suddenly worsened tonight. She reports associated right leg swelling. Pt has PMHx of clot before she was 51 years old and was on Warfarin. She also has hx of thrombitis She denies shortness of breath.   Past Medical History:  Diagnosis Date  . Asthma   . 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 SOB (shortness of breath)   . Morbid obesity ([place])   . Asthma exacerbation 08/16/2014  . Acute respiratory failure with hypoxia ([place]) 08/16/2014  . Hypokalemia 08/16/2014  . Leukocytosis 08/16/2014  . CAP (community acquired pneumonia)   . HTN (hypertension) 09/26/2011    Past Surgical History:  Procedure Laterality Date  . Blood clot     After a pregnancy 26 years ago  . TUBAL LIGATION      OB History    No data available       Home Medications    Prior to Admission medications   Medication Sig Start Date End Date Taking? Authorizing Provider  albuterol (PROVENTIL) (2.5 MG/3ML) 0.083% nebulizer solution Take 2.5 mg by nebulization every 6 (six) hours as needed. Wheezing    Historical Provider, MD  budesonide-formoterol (SYMBICORT) 80-4.5 MCG/ACT inhaler Inhale 2 puffs into the lungs 2 (two) times daily. Patient taking differently: Inhale 2 puffs into the lungs 2 (two) times daily as  needed.  08/18/14   Barton Dubois, MD  CALCIUM PO Take by mouth daily.    Historical Provider, MD  cetirizine (ZYRTEC) 10 MG tablet Take 10 mg by mouth at bedtime.    Historical Provider, MD  CYANOCOBALAMIN PO Take by mouth daily.    Historical Provider, MD  doxycycline (VIBRAMYCIN) 100 MG capsule Take 1 capsule (100 mg total) by mouth 2 (two) times daily. 11/01/16   Orpah Greek, MD  furosemide (LASIX) 20 MG tablet Take 20 mg by mouth daily as needed for fluid or edema.     Historical Provider, MD  meclizine (ANTIVERT) 25 MG tablet Take 1 tablet (25 mg total) by mouth 3 (three) times daily as needed for dizziness. Patient not taking: Reported on 09/26/2016 10/29/15   Davonna Belling, MD  montelukast (SINGULAIR) 10 MG tablet Take 10 mg by mouth daily.     Historical Provider, MD  Multiple Vitamin (MULTIVITAMIN WITH MINERALS) TABS tablet Take 1 tablet by mouth daily.    Historical Provider, MD  Omega-3 Fatty Acids (FISH OIL) 1000 MG CAPS Take 1 capsule by mouth daily.    Historical Provider, MD  sulfamethoxazole-trimethoprim (BACTRIM DS,SEPTRA DS) 800-160 MG tablet Take 1 tablet by mouth 2 (two) times daily. Patient not taking: Reported on 09/26/2016 10/29/15   Davonna Belling, MD  traMADol (ULTRAM) 50 MG tablet Take 1 tablet (50 mg total) by mouth every 6 (six) hours as needed. 11/01/16  Orpah Greek, MD    Family History Family History  Problem Relation Age of Onset  . Hypertension Father     Social History Social History  Substance Use Topics  . Smoking status: Former Smoker    Packs/day: 0.30    Years: 20.00    Types: Cigarettes    Quit date: 02/24/2011  . Smokeless tobacco: Never Used  . Alcohol use Yes     Comment: occasionally     Allergies   Meloxicam; Methocarbamol; and Penicillins   Review of Systems Review of Systems  Respiratory: Negative for shortness of breath.   Cardiovascular: Positive for leg swelling.  All other systems reviewed and are  negative.    Physical Exam Updated Vital Signs BP 134/75 (BP Location: Left Arm)   Pulse 79   Temp 97.9 F (36.6 C) (Oral)   Resp 18   Ht 5\' 2"  (1.575 m)   Wt 258 lb (117 kg)   SpO2 97%   BMI 47.19 kg/m   Physical Exam  Constitutional: She is oriented to person, place, and time. She appears well-developed and well-nourished. No distress.  HENT:  Head: Normocephalic and atraumatic.  Right Ear: Hearing normal.  Left Ear: Hearing normal.  Nose: Nose normal.  Mouth/Throat: Oropharynx is clear and moist and mucous membranes are normal.  Eyes: Conjunctivae and EOM are normal. Pupils are equal, round, and reactive to light.  Neck: Normal range of motion. Neck supple.  Cardiovascular: Regular rhythm, S1 normal and S2 normal.  Exam reveals no gallop and no friction rub.   No murmur heard. Palpable dorsalis pedis pulse  Pulmonary/Chest: Effort normal and breath sounds normal. No respiratory distress. She exhibits no tenderness.  Abdominal: Soft. Normal appearance and bowel sounds are normal. There is no hepatosplenomegaly. There is no tenderness. There is no rebound, no guarding, no tenderness at McBurney's point and negative Murphy's sign. No hernia.  Musculoskeletal: Normal range of motion.  Tenderness and erythema of the right lateral calf  No palpable venous chords No significant calf swelling   Neurological: She is alert and oriented to person, place, and time. She has normal strength. No cranial nerve deficit or sensory deficit. Coordination normal. GCS eye subscore is 4. GCS verbal subscore is 5. GCS motor subscore is 6.  Skin: Skin is warm, dry and intact. No rash noted. No cyanosis.  Psychiatric: She has a normal mood and affect. Her speech is normal and behavior is normal. Thought content normal.  Nursing note and vitals reviewed.  ED Treatments / Results  DIAGNOSTIC STUDIES: Oxygen Saturation is 98% on RA, normal by my interpretation.    COORDINATION OF CARE: 2:59 AM  Discussed treatment plan with pt at bedside which includes D-Dimer, CBC and BMP and pt agreed to plan.  Labs (all labs ordered are listed, but only abnormal results are displayed) Labs Reviewed  BASIC METABOLIC PANEL - Abnormal; Notable for the following:       Result Value   Glucose, Bld 100 (*)    All other components within normal limits  CBC WITH DIFFERENTIAL/PLATELET - Abnormal; Notable for the following:    MCV 77.2 (*)    All other components within normal limits  D-DIMER, QUANTITATIVE (NOT AT [facility])    EKG  EKG Interpretation None       Radiology No results found.  Procedures Procedures (including critical care time)  Medications Ordered in ED Medications - No data to display   Initial Impression / Assessment and Plan / ED  Course  I have reviewed the triage vital signs and the nursing notes.  Pertinent labs & imaging results that were available during my care of the patient were reviewed by me and considered in my medical decision making (see chart for details).    Patient presents to the emergency department for evaluation of pain in the left calf. She reports that the area is tender to the touch. Examination does reveal some erythema without warmth. No palpable venous cords. Suspect early cellulitis, can't rule out phlebitis. Examination is less concerning for DVT. 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 Not on anticoagulation. Her d-dimer is normal. This is reassuring. We'll send for outpatient duplex, but suspect this will be normal. Will treat for infectious etiology.  I personally performed the services described in this documentation, which was scribed in my presence. The recorded information has been reviewed and is accurate.   Final Clinical Impressions(s) / ED Diagnoses   Final diagnoses:  Cellulitis of left lower extremity    New Prescriptions New Prescriptions   DOXYCYCLINE (VIBRAMYCIN) 100 MG CAPSULE    Take 1 capsule (100 mg  total) by mouth 2 (two) times daily.   TRAMADOL (ULTRAM) 50 MG TABLET    Take 1 tablet (50 mg total) by mouth every 6 (six) hours as needed.     Orpah Greek, MD 11/01/16 [phone]

## 2016-11-01 NOTE — ED Triage Notes (Signed)
Pt states that she has had R lower leg swelling and pain x 1 week. Denies injury. Concerned that she may have a blood clot. Alert and oriented. Denies SOB.

## 2016-11-02 ENCOUNTER — Ambulatory Visit (HOSPITAL_COMMUNITY)
Admission: RE | Admit: 2016-11-02 | Discharge: 2016-11-02 | Disposition: A | Discharge status: Home / Self Care | Payer: BLUE CROSS/BLUE SHIELD | Source: Ambulatory Visit | Attending: Emergency Medicine | Admitting: Emergency Medicine

## 2016-11-02 DIAGNOSIS — M7989 Other specified soft tissue disorders: Secondary | ICD-10-CM | POA: Insufficient documentation

## 2016-11-02 DIAGNOSIS — M79605 Pain in left leg: Secondary | ICD-10-CM | POA: Insufficient documentation

## 2016-11-02 DIAGNOSIS — M79609 Pain in unspecified limb: Secondary | ICD-10-CM

## 2017-01-16 ENCOUNTER — Emergency Department (HOSPITAL_COMMUNITY)
Admission: EM | Admit: 2017-01-16 | Discharge: 2017-01-16 | Disposition: A | Discharge status: Home / Self Care | Payer: Self-pay | Attending: Emergency Medicine | Admitting: Emergency Medicine

## 2017-01-16 ENCOUNTER — Emergency Department (HOSPITAL_COMMUNITY): Payer: Self-pay

## 2017-01-16 ENCOUNTER — Encounter (HOSPITAL_COMMUNITY): Payer: Self-pay | Admitting: Emergency Medicine

## 2017-01-16 DIAGNOSIS — Z79899 Other long term (current) drug therapy: Secondary | ICD-10-CM | POA: Insufficient documentation

## 2017-01-16 DIAGNOSIS — I10 Essential (primary) hypertension: Secondary | ICD-10-CM | POA: Insufficient documentation

## 2017-01-16 DIAGNOSIS — Z87891 Personal history of nicotine dependence: Secondary | ICD-10-CM | POA: Insufficient documentation

## 2017-01-16 DIAGNOSIS — J45901 Unspecified asthma with (acute) exacerbation: Secondary | ICD-10-CM | POA: Insufficient documentation

## 2017-01-16 DIAGNOSIS — J4521 Mild intermittent asthma with (acute) exacerbation: Secondary | ICD-10-CM

## 2017-01-16 LAB — BASIC METABOLIC PANEL
ANION GAP: 9 (ref 5–15)
BUN: 18 mg/dL (ref 6–20)
CHLORIDE: 105 mmol/L (ref 101–111)
CO2: 25 mmol/L (ref 22–32)
Calcium: 9.1 mg/dL (ref 8.9–10.3)
Creatinine, Ser: 0.85 mg/dL (ref 0.44–1.00)
Glucose, Bld: 95 mg/dL (ref 65–99)
POTASSIUM: 3.4 mmol/L — AB (ref 3.5–5.1)
SODIUM: 139 mmol/L (ref 135–145)

## 2017-01-16 LAB — CBC
HCT: 38 % (ref 36.0–46.0)
HEMOGLOBIN: 13.3 g/dL (ref 12.0–15.0)
MCH: 27.4 pg (ref 26.0–34.0)
MCHC: 35 g/dL (ref 30.0–36.0)
MCV: 78.2 fL (ref 78.0–100.0)
PLATELETS: 248 10*3/uL (ref 150–400)
RBC: 4.86 MIL/uL (ref 3.87–5.11)
RDW: 15 % (ref 11.5–15.5)
WBC: 9.7 10*3/uL (ref 4.0–10.5)

## 2017-01-16 MED ORDER — PREDNISONE 20 MG PO TABS: 40.0000 mg | ORAL_TABLET | Freq: Every day | ORAL | 0 refills | Status: DC

## 2017-01-16 MED ORDER — ALBUTEROL SULFATE HFA 108 (90 BASE) MCG/ACT IN AERS: 1.0000 | INHALATION_SPRAY | Freq: Four times a day (QID) | RESPIRATORY_TRACT | 0 refills | Status: AC | PRN

## 2017-01-16 MED ORDER — MAGNESIUM SULFATE 2 GM/50ML IV SOLN
2.0000 g | Freq: Once | INTRAVENOUS | Status: AC
  Administered 2017-01-16: 2 g via INTRAVENOUS
  Filled 2017-01-16: qty 50

## 2017-01-16 MED ORDER — METHYLPREDNISOLONE SODIUM SUCC 125 MG IJ SOLR
125.0000 mg | Freq: Once | INTRAMUSCULAR | Status: AC
  Administered 2017-01-16: 125 mg via INTRAVENOUS
  Filled 2017-01-16: qty 2

## 2017-01-16 MED ORDER — ALBUTEROL (5 MG/ML) CONTINUOUS INHALATION SOLN
10.0000 mg/h | INHALATION_SOLUTION | Freq: Once | RESPIRATORY_TRACT | Status: AC
  Administered 2017-01-16: 10 mg/h via RESPIRATORY_TRACT
  Filled 2017-01-16: qty 20

## 2017-01-16 MED ORDER — ALBUTEROL SULFATE (2.5 MG/3ML) 0.083% IN NEBU
5.0000 mg | INHALATION_SOLUTION | Freq: Once | RESPIRATORY_TRACT | Status: AC
  Administered 2017-01-16: 5 mg via RESPIRATORY_TRACT
  Filled 2017-01-16: qty 6

## 2017-01-16 NOTE — Discharge Instructions (Signed)
Get help right away if: °You are getting worse. °You have trouble breathing. If severe, call your local emergency services (911 in the [place] °You develop chest pain or discomfort. °You are vomiting. °You are not able to keep fluids down. °You are coughing up yellow, green, brown, or bloody sputum. °You have a fever and your symptoms suddenly get worse. °You have trouble swallowing °

## 2017-01-16 NOTE — ED Notes (Signed)
Ambulated pt. SPO2 at 97%.

## 2017-01-16 NOTE — ED Provider Notes (Signed)
[facility] DEPT Provider Note   CSN: [id number] Arrival date & time: 01/16/17  1543     History   Chief Complaint Chief Complaint  Patient presents with  . Asthma    HPI Christina Robles is a 51 y.o. female who presents emergency Department with chief complaint of asthma exacerbation. The patient has a history of asthma and allergies. She takes Symbicort daily, but is out of her albuterol. She's had 3 days of worsening asthma symptoms including using a difficulty in breathing, chest tightness, moderate wheezing which has diminished over the past day because of worsening ability to move air. She has a history of previous hospitalizations but denies intubation. She denies any URI symptoms or fever.  HPI  Past Medical History:  Diagnosis Date  . Asthma   . 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 SOB (shortness of breath)   . Morbid obesity ([place])   . Asthma exacerbation 08/16/2014  . Acute respiratory failure with hypoxia ([place]) 08/16/2014  . Hypokalemia 08/16/2014  . Leukocytosis 08/16/2014  . CAP (community acquired pneumonia)   . HTN (hypertension) 09/26/2011    Past Surgical History:  Procedure Laterality Date  . Blood clot     After a pregnancy 26 years ago  . TUBAL LIGATION      OB History    No data available       Home Medications    Prior to Admission medications   Medication Sig Start Date End Date Taking? Authorizing Provider  budesonide-formoterol (SYMBICORT) 80-4.5 MCG/ACT inhaler Inhale 2 puffs into the lungs 2 (two) times daily. 08/18/14  Yes Barton Dubois, MD  CALCIUM PO Take 1 tablet by mouth daily.    Yes Historical Provider, MD  cetirizine (ZYRTEC) 10 MG tablet Take 10 mg by mouth at bedtime.   Yes Historical Provider, MD  furosemide (LASIX) 20 MG tablet Take 20 mg by mouth daily as needed for fluid or edema.    Yes Historical Provider, MD  Ibuprofen 200 MG CAPS Take 1 capsule by mouth 3 (three)  times daily as needed (cold symptoms).   Yes Historical Provider, MD  montelukast (SINGULAIR) 10 MG tablet Take 10 mg by mouth daily.    Yes Historical Provider, MD  Multiple Vitamin (MULTIVITAMIN WITH MINERALS) TABS tablet Take 1 tablet by mouth daily.   Yes Historical Provider, MD  Omega-3 Fatty Acids (FISH OIL) 1000 MG CAPS Take 1 capsule by mouth daily.   Yes Historical Provider, MD  vitamin B-12 (CYANOCOBALAMIN) 500 MCG tablet Take 500 mcg by mouth daily.   Yes Historical Provider, MD  doxycycline (VIBRAMYCIN) 100 MG capsule Take 1 capsule (100 mg total) by mouth 2 (two) times daily. Patient not taking: Reported on 01/16/2017 11/01/16   Orpah Greek, MD  meclizine (ANTIVERT) 25 MG tablet Take 1 tablet (25 mg total) by mouth 3 (three) times daily as needed for dizziness. Patient not taking: Reported on 09/26/2016 10/29/15   Davonna Belling, MD  sulfamethoxazole-trimethoprim (BACTRIM DS,SEPTRA DS) 800-160 MG tablet Take 1 tablet by mouth 2 (two) times daily. Patient not taking: Reported on 09/26/2016 10/29/15   Davonna Belling, MD  traMADol (ULTRAM) 50 MG tablet Take 1 tablet (50 mg total) by mouth every 6 (six) hours as needed. Patient not taking: Reported on 01/16/2017 11/01/16   Orpah Greek, MD    Family History Family History  Problem Relation Age of Onset  . Hypertension Father  Social History Social History  Substance Use Topics  . Smoking status: Former Smoker    Packs/day: 0.30    Years: 20.00    Types: Cigarettes    Quit date: 02/24/2011  . Smokeless tobacco: Never Used  . Alcohol use Yes     Comment: occasionally     Allergies   Meloxicam; Methocarbamol; and Penicillins   Review of Systems Review of Systems Ten systems reviewed and are negative for acute change, except as noted in the HPI.    Physical Exam Updated Vital Signs BP 134/67   Pulse 94   Temp 98.9 F (37.2 C) (Oral)   Resp (!) 21   SpO2 97%   Physical Exam  Constitutional:  She is oriented to person, place, and time. She appears well-developed and well-nourished. No distress.  HENT:  Head: Normocephalic and atraumatic.  Eyes: Conjunctivae are normal. No scleral icterus.  Neck: Normal range of motion.  Cardiovascular: Normal rate, regular rhythm and normal heart sounds.  Exam reveals no gallop and no friction rub.   No murmur heard. Pulmonary/Chest: No respiratory distress. She has no wheezes.  Patient with very poor air movement, accessory muscle use. She sitting upright, very straight. Her work of breathing is labored. She can speak in full sentences. Minimal air movement, no wheezes heard.  Abdominal: Soft. Bowel sounds are normal. She exhibits no distension and no mass. There is no tenderness. There is no guarding.  Neurological: She is alert and oriented to person, place, and time.  Skin: Skin is warm and dry. She is not diaphoretic.  Psychiatric: Her behavior is normal.  Nursing note and vitals reviewed.    ED Treatments / Results  Labs (all labs ordered are listed, but only abnormal results are displayed) Labs Reviewed  CBC  BASIC METABOLIC PANEL    EKG  EKG Interpretation  Date/Time:  Monday January 16 2017 16:10:17 EDT Ventricular Rate:  86 PR Interval:    QRS Duration: 104 QT Interval:  368 QTC Calculation: 441 R Axis:   32 Text Interpretation:  Sinus rhythm RSR' in V1 or V2, right VCD or RVH No significant change since last tracing Confirmed by KNAPP  MD-J, JON ([id number]) on 01/16/2017 4:48:02 PM       Radiology Dg Chest 2 View  Result Date: 01/16/2017 CLINICAL DATA:  Asthma flare since this morning possibly made worse by pollen. Home medications not helping. Wheezing on exam. The patient was unable to take a deep breath. EXAM: CHEST  2 VIEW COMPARISON:  Chest x-ray of November 17, 2014 FINDINGS: The lungs are mildly hypoinflated. The interstitial markings are increased. There is no alveolar infiltrate. There is no pleural effusion. The  cardiac silhouette is mildly enlarged but is accentuated by the hypoinflation. The pulmonary vascularity is indistinct. IMPRESSION: Hypoinflation accentuates the lung markings. This likely reflects known reactive airway disease. There is no focal pneumonia nor objective evidence of CHF. A repeat PA and lateral chest x-ray when the patient is able to take a deeper inspiration would be useful. Electronically Signed   By: David  Martinique M.D.   On: 01/16/2017 16:57    Procedures Procedures (including critical care time)  Medications Ordered in ED Medications  albuterol (PROVENTIL,VENTOLIN) solution continuous neb (not administered)  magnesium sulfate IVPB 2 g 50 mL (not administered)  methylPREDNISolone sodium succinate (SOLU-MEDROL) 125 mg/2 mL injection 125 mg (not administered)  albuterol (PROVENTIL) (2.5 MG/3ML) 0.083% nebulizer solution 5 mg (5 mg Nebulization Given 01/16/17 1610)  Initial Impression / Assessment and Plan / ED Course  I have reviewed the triage vital signs and the nursing notes.  Pertinent labs & imaging results that were available during my care of the patient were reviewed by me and considered in my medical decision making (see chart for details).     Patient ambulated in ED with O2 saturations maintained >90, no current signs of respiratory distress. Lung exam improved after nebulizer treatment. Prednisone given in the ED and pt will bd dc with 5 day burst. Pt states they are breathing at baseline. Pt has been instructed to continue using prescribed medications and to speak with PCP about today's exacerbation.   Final Clinical Impressions(s) / ED Diagnoses   Final diagnoses:  None    New Prescriptions New Prescriptions   No medications on file     Margarita Mail, PA-C 01/16/17 2013    Drenda Freeze, MD 01/16/17 [phone]

## 2017-01-16 NOTE — ED Notes (Signed)
Respiratory at bedside.

## 2017-01-16 NOTE — ED Notes (Signed)
Respiratory called for breathing treatment.

## 2017-01-16 NOTE — ED Notes (Signed)
Pt reports she does not feel better after nebulizer

## 2017-01-16 NOTE — ED Triage Notes (Signed)
Pt reports asthma flare up since this am. Asthma worsened by pollen. Home medications not helping. Wheezing noted in triage. Pt speaking in short sentences.

## 2017-06-01 ENCOUNTER — Encounter (HOSPITAL_COMMUNITY): Payer: Self-pay | Admitting: Nurse Practitioner

## 2017-06-01 ENCOUNTER — Ambulatory Visit (HOSPITAL_COMMUNITY)
Admission: EM | Admit: 2017-06-01 | Discharge: 2017-06-01 | Disposition: A | Discharge status: Home / Self Care | Payer: Self-pay | Attending: Family Medicine | Admitting: Family Medicine

## 2017-06-01 DIAGNOSIS — M545 Low back pain, unspecified: Secondary | ICD-10-CM

## 2017-06-01 MED ORDER — DICLOFENAC SODIUM 75 MG PO TBEC: 75.0000 mg | DELAYED_RELEASE_TABLET | Freq: Two times a day (BID) | ORAL | 0 refills | Status: DC

## 2017-06-01 MED ORDER — PREDNISONE 10 MG (21) PO TBPK: ORAL_TABLET | ORAL | 0 refills | Status: DC

## 2017-06-01 NOTE — ED Provider Notes (Signed)
[facility]   [id number] 06/01/17 Arrival Time: 9983  ASSESSMENT & PLAN:  1. Acute bilateral low back pain without sciatica     Meds ordered this encounter  Medications  . diclofenac (VOLTAREN) 75 MG EC tablet    Sig: Take 1 tablet (75 mg total) by mouth 2 (two) times daily.    Dispense:  14 tablet    Refill:  0  . predniSONE (STERAPRED UNI-PAK 21 TAB) 10 MG (21) TBPK tablet    Sig: Take as directed.    Dispense:  21 tablet    Refill:  0   Back exercises given. Ensure proper ROM. Will F/U with PCP if not improving within the next week. Reviewed expectations re: course of current medical issues. Questions answered. Outlined signs and symptoms indicating need for more acute intervention. Patient verbalized understanding. After Visit Summary given.   SUBJECTIVE:  Sheetal Lyall is a 51 y.o. female who presents with complaint of low back discomfort. Onset 2 week ago. Described as aching and dull. No injury or trauma. Described symptoms unchanged since onset with occasional acute exacerbation. Aggravating factors: standing. Alleviating factors: NSAIDs. Ibuprofen 800mg  BID with mild help. Associated symptoms: none. The patient denies anorexia, constipation, nausea and vomiting. History of similar symptoms: No. No previous injuries. Ambulatory without difficulty. Discomfort affects sleep. No LE sensation changes or weakness reported. No urinary symptoms.  ROS: As per HPI.  OBJECTIVE:  Vitals:   06/01/17 1020  BP: 125/90  Pulse: 90  Resp: 16  Temp: 98.2 F (36.8 C)  TempSrc: Oral  SpO2: 95%    General appearance: alert; no distress; obese Abdomen: soft; no tenderness; bowel sounds normal; no masses or organomegaly; no guarding or rebound tenderness Back: lumbar paraspinal tenderness; no midline tenderness; FROM at hips but moves slowly Extremities: no cyanosis or edema; symmetrical with no gross deformities Skin: warm and dry Neurologic: normal  gait Psychological: alert and cooperative; normal mood and affect  Allergies  Allergen Reactions  . Methocarbamol     "Closed my throat up."   . Penicillins Nausea And Vomiting                                                    Past Medical History:  Diagnosis Date  . Asthma   . 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 Marital status: Single    Spouse name: N/A  . Number of children: 4  . Years of education: N/A   Occupational History  . 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   Social History Main Topics  . Smoking status: Former Smoker    Packs/day: 0.30    Years: 20.00    Types: Cigarettes    Quit date: 02/24/2011  . Smokeless tobacco: Never Used  . Alcohol use Yes     Comment: occasionally  . Drug use: No  . Sexual activity: Not on file   Other Topics Concern  . Not on file   Social History Narrative  . No narrative on file   Family History  Problem Relation Age of Onset  . Hypertension Father    Past Surgical History:  Procedure Laterality Date  . Blood clot     After a pregnancy 26 years ago  . TUBAL LIGATION  Vanessa Kick, MD 06/01/17 [phone]

## 2017-06-01 NOTE — ED Triage Notes (Signed)
Pt presents with c/o lower back pain. The pain began about two weeks ago. The pain radiates into her sides and hips. She does not recall any exertion or injuries prior to the pain. She works as a Human resources officer. She has tried ibuprofen at home with no relief

## 2017-11-02 ENCOUNTER — Encounter (HOSPITAL_COMMUNITY): Payer: Self-pay | Admitting: Family Medicine

## 2017-11-02 ENCOUNTER — Ambulatory Visit (HOSPITAL_COMMUNITY)
Admission: EM | Admit: 2017-11-02 | Discharge: 2017-11-02 | Disposition: A | Discharge status: Home / Self Care | Payer: Self-pay | Attending: Family Medicine | Admitting: Family Medicine

## 2017-11-02 DIAGNOSIS — J40 Bronchitis, not specified as acute or chronic: Secondary | ICD-10-CM

## 2017-11-02 MED ORDER — IPRATROPIUM BROMIDE 0.06 % NA SOLN: 2.0000 | Freq: Four times a day (QID) | NASAL | 0 refills | Status: DC

## 2017-11-02 MED ORDER — AZITHROMYCIN 250 MG PO TABS: 250.0000 mg | ORAL_TABLET | Freq: Every day | ORAL | 0 refills | Status: DC

## 2017-11-02 MED ORDER — FLUTICASONE PROPIONATE 50 MCG/ACT NA SUSP: 2.0000 | Freq: Every day | NASAL | 0 refills | Status: DC

## 2017-11-02 MED ORDER — PREDNISONE 20 MG PO TABS: 40.0000 mg | ORAL_TABLET | Freq: Every day | ORAL | 0 refills | Status: AC

## 2017-11-02 NOTE — ED Provider Notes (Signed)
[place]    CSN: [id number] Arrival date & time: 11/02/17  1003     History   Chief Complaint Chief Complaint  Patient presents with  . Cough    HPI Christina Robles is a 52 y.o. female.   52 year old female with history of asthma comes in for 3-day history of URI symptoms.  She has had increased productive cough, congestion, rhinorrhea. Denies fever, chills, night sweats. States using OTC cold meds without relief. Has had to use albuterol inhaler without relief. Denies fever, chills, night sweats. Positive sick contact.  Former smoker, 15-20 years, 1/3ppd. Patient states she had to self discontinue Symbicort due to cost, and states once her insurance is back, will resume.      Past Medical History:  Diagnosis Date  . Asthma   . 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 SOB (shortness of breath)   . Morbid obesity ([place])   . Asthma exacerbation 08/16/2014  . Acute respiratory failure with hypoxia ([place]) 08/16/2014  . Hypokalemia 08/16/2014  . Leukocytosis 08/16/2014  . CAP (community acquired pneumonia)   . HTN (hypertension) 09/26/2011    Past Surgical History:  Procedure Laterality Date  . Blood clot     After a pregnancy 26 years ago  . TUBAL LIGATION      OB History    No data available       Home Medications    Prior to Admission medications   Medication Sig Start Date End Date Taking? Authorizing Provider  albuterol (PROVENTIL HFA;VENTOLIN HFA) 108 (90 Base) MCG/ACT inhaler Inhale 1-2 puffs into the lungs every 6 (six) hours as needed for wheezing or shortness of breath. 01/16/17   Harris, Vernie Shanks, PA-C  azithromycin (ZITHROMAX) 250 MG tablet Take 1 tablet (250 mg total) by mouth daily. Take first 2 tablets together, then 1 every day until finished. 11/02/17   Tasia Catchings, Ginia Rudell V, PA-C  budesonide-formoterol (SYMBICORT) 80-4.5 MCG/ACT inhaler Inhale 2 puffs into the lungs 2 (two) times daily. 08/18/14    Barton Dubois, MD  CALCIUM PO Take 1 tablet by mouth daily.     [provider]  cetirizine (ZYRTEC) 10 MG tablet Take 10 mg by mouth at bedtime.    [provider]  diclofenac (VOLTAREN) 75 MG EC tablet Take 1 tablet (75 mg total) by mouth 2 (two) times daily. 06/01/17   Vanessa Kick, MD  fluticasone (FLONASE) 50 MCG/ACT nasal spray Place 2 sprays into both nostrils daily. 11/02/17   Tasia Catchings, Berniece Abid V, PA-C  furosemide (LASIX) 20 MG tablet Take 20 mg by mouth daily as needed for fluid or edema.     [provider]  Ibuprofen 200 MG CAPS Take 1 capsule by mouth 3 (three) times daily as needed (cold symptoms).    [provider]  ipratropium (ATROVENT) 0.06 % nasal spray Place 2 sprays into both nostrils 4 (four) times daily. 11/02/17   Ok Edwards, PA-C  meclizine (ANTIVERT) 25 MG tablet Take 1 tablet (25 mg total) by mouth 3 (three) times daily as needed for dizziness. Patient not taking: Reported on 09/26/2016 10/29/15   Davonna Belling, MD  montelukast (SINGULAIR) 10 MG tablet Take 10 mg by mouth daily.     [provider]  Multiple Vitamin (MULTIVITAMIN WITH MINERALS) TABS tablet Take 1 tablet by mouth daily.    [provider]  Omega-3 Fatty Acids (FISH OIL) 1000 MG CAPS Take 1  capsule by mouth daily.    [provider]  predniSONE (DELTASONE) 20 MG tablet Take 2 tablets (40 mg total) by mouth daily for 4 days. 11/02/17 11/06/17  Ok Edwards, PA-C  traMADol (ULTRAM) 50 MG tablet Take 1 tablet (50 mg total) by mouth every 6 (six) hours as needed. Patient not taking: Reported on 01/16/2017 11/01/16   Orpah Greek, MD  vitamin B-12 (CYANOCOBALAMIN) 500 MCG tablet Take 500 mcg by mouth daily.    [provider]    Family History Family History  Problem Relation Age of Onset  . Hypertension Father     Social History Social History   Tobacco Use  . Smoking status: Former Smoker    Packs/day: 0.30    Years: 20.00    Pack  years: 6.00    Types: Cigarettes    Last attempt to quit: 02/24/2011    Years since quitting: 6.6  . Smokeless tobacco: Never Used  Substance Use Topics  . Alcohol use: Yes    Comment: occasionally  . Drug use: No     Allergies   Methocarbamol and Penicillins   Review of Systems Review of Systems  Reason unable to perform ROS: See HPI as above.     Physical Exam Triage Vital Signs ED Triage Vitals  Enc Vitals Group     BP 11/02/17 1034 (!) 142/74     Pulse Rate 11/02/17 1034 99     Resp 11/02/17 1034 18     Temp 11/02/17 1034 98.8 F (37.1 C)     Temp Source 11/02/17 1034 Oral     SpO2 11/02/17 1034 99 %     Weight --      Height --      Head Circumference --      Peak Flow --      Pain Score 11/02/17 1033 6     Pain Loc --      Pain Edu? --      Excl. in [place]? --    No data found.  Updated Vital Signs BP (!) 142/74 (BP Location: Left Arm)   Pulse 99   Temp 98.8 F (37.1 C) (Oral)   Resp 18   SpO2 99%   Physical Exam  Constitutional: She is oriented to person, place, and time. She appears well-developed and well-nourished. No distress.  HENT:  Head: Normocephalic and atraumatic.  Right Ear: External ear and ear canal normal. Tympanic membrane is erythematous. Tympanic membrane is not bulging.  Left Ear: External ear and ear canal normal. Tympanic membrane is erythematous. Tympanic membrane is not bulging.  Nose: Mucosal edema and rhinorrhea present. Right sinus exhibits maxillary sinus tenderness and frontal sinus tenderness. Left sinus exhibits maxillary sinus tenderness and frontal sinus tenderness.  Mouth/Throat: Uvula is midline, oropharynx is clear and moist and mucous membranes are normal. No tonsillar exudate.  Eyes: Conjunctivae are normal. Pupils are equal, round, and reactive to light.  Neck: Normal range of motion. Neck supple.  Cardiovascular: Normal rate, regular rhythm and normal heart sounds. Exam reveals no gallop and no friction rub.  No  murmur heard. Pulmonary/Chest: Effort normal and breath sounds normal. She has no decreased breath sounds. She has no wheezes. She has no rhonchi. She has no rales.  Lymphadenopathy:    She has no cervical adenopathy.  Neurological: She is alert and oriented to person, place, and time.  Skin: Skin is warm and dry.  Psychiatric: She has a normal mood and affect. Her  behavior is normal. Judgment normal.     UC Treatments / Results  Labs (all labs ordered are listed, but only abnormal results are displayed) Labs Reviewed - No data to display  EKG  EKG Interpretation None       Radiology No results found.  Procedures Procedures (including critical care time)  Medications Ordered in UC Medications - No data to display   Initial Impression / Assessment and Plan / UC Course  I have reviewed the triage vital signs and the nursing notes.  Pertinent labs & imaging results that were available during my care of the patient were reviewed by me and considered in my medical decision making (see chart for details).    Given smoking history, will treat as COPD exacerbation with azithromycin and prednisone. Other symptomatic treatment discussed. Continue albuterol as needed. Return precautions given. Patient expresses understanding and agrees to plan.   Final Clinical Impressions(s) / UC Diagnoses   Final diagnoses:  Bronchitis    ED Discharge Orders        Ordered    azithromycin (ZITHROMAX) 250 MG tablet  Daily     11/02/17 1048    predniSONE (DELTASONE) 20 MG tablet  Daily     11/02/17 1048    fluticasone (FLONASE) 50 MCG/ACT nasal spray  Daily     11/02/17 1048    ipratropium (ATROVENT) 0.06 % nasal spray  4 times daily     11/02/17 1048        [address], [place] 11/02/17 1058

## 2017-11-02 NOTE — ED Triage Notes (Signed)
Pt here for 3 days of cough and congestion with green mucous. Hx of asthma and using OTC meds without relief. Using zyrtec, inhaler and Advil cold and sinus.

## 2017-11-02 NOTE — Discharge Instructions (Signed)
Azithromycin and prednisone as directed.  Start flonase, atrovent nasal spray for nasal congestion. You can use over the counter nasal saline rinse such as neti pot for nasal congestion. Keep hydrated, your urine should be clear to pale yellow in color. Tylenol/motrin for fever and pain. Monitor for any worsening of symptoms, chest pain, shortness of breath, wheezing, swelling of the throat, follow up for reevaluation.   For sore throat try using a honey-based tea. Use 3 teaspoons of honey with juice squeezed from half lemon. Place shaved pieces of ginger into 1/2-1 cup of water and warm over stove top. Then mix the ingredients and repeat every 4 hours as needed.

## 2018-03-04 ENCOUNTER — Encounter (HOSPITAL_COMMUNITY): Payer: Self-pay | Admitting: Emergency Medicine

## 2018-03-04 ENCOUNTER — Ambulatory Visit (HOSPITAL_COMMUNITY)
Admission: EM | Admit: 2018-03-04 | Discharge: 2018-03-04 | Disposition: A | Discharge status: Home / Self Care | Payer: Self-pay | Attending: Emergency Medicine | Admitting: Emergency Medicine

## 2018-03-04 DIAGNOSIS — L02411 Cutaneous abscess of right axilla: Secondary | ICD-10-CM

## 2018-03-04 MED ORDER — DOXYCYCLINE HYCLATE 100 MG PO CAPS: 100.0000 mg | ORAL_CAPSULE | Freq: Two times a day (BID) | ORAL | 0 refills | Status: AC

## 2018-03-04 NOTE — Discharge Instructions (Signed)
Use anti-inflammatories for pain/swelling. You may take up to 800 mg Ibuprofen every 8 hours with food. You may supplement Ibuprofen with Tylenol [phone] mg every 8 hours.   Doxycycline twice daily for 10 days  Continue warm compresses and massage to area to express further drainage  Return if symptoms not resolving

## 2018-03-04 NOTE — ED Triage Notes (Signed)
Pt c/o boil in her R armpit.

## 2018-03-05 NOTE — ED Provider Notes (Signed)
[place]    CSN: [id number] Arrival date & time: 03/04/18  1504     History   Chief Complaint Chief Complaint  Patient presents with  . Boil    HPI Christina Robles is a 52 y.o. female history of asthma and hypertension presenting today for evaluation of an abscess.  Patient has had an abscess to her right axilla.  Notes it has been there for approximately 3 weeks.  Denies any drainage, but does note that at one area but came to a head.  Has been applying warm compresses without relief.  Denies previous issues with abscesses.  Requesting drainage.  Denies fevers.  Able to fully move shoulder.  Denies chest pain or shortness of breath.  HPI  Past Medical History:  Diagnosis Date  . Asthma   . 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 SOB (shortness of breath)   . Morbid obesity ([place])   . Asthma exacerbation 08/16/2014  . Acute respiratory failure with hypoxia ([place]) 08/16/2014  . Hypokalemia 08/16/2014  . Leukocytosis 08/16/2014  . CAP (community acquired pneumonia)   . HTN (hypertension) 09/26/2011    Past Surgical History:  Procedure Laterality Date  . Blood clot     After a pregnancy 26 years ago  . TUBAL LIGATION      OB History   None      Home Medications    Prior to Admission medications   Medication Sig Start Date End Date Taking? Authorizing Provider  albuterol (PROVENTIL HFA;VENTOLIN HFA) 108 (90 Base) MCG/ACT inhaler Inhale 1-2 puffs into the lungs every 6 (six) hours as needed for wheezing or shortness of breath. 01/16/17   Harris, Vernie Shanks, PA-C  azithromycin (ZITHROMAX) 250 MG tablet Take 1 tablet (250 mg total) by mouth daily. Take first 2 tablets together, then 1 every day until finished. 11/02/17   Tasia Catchings, Amy V, PA-C  budesonide-formoterol (SYMBICORT) 80-4.5 MCG/ACT inhaler Inhale 2 puffs into the lungs 2 (two) times daily. 08/18/14   Barton Dubois, MD  CALCIUM PO Take 1 tablet by mouth daily.      [provider]  cetirizine (ZYRTEC) 10 MG tablet Take 10 mg by mouth at bedtime.    [provider]  diclofenac (VOLTAREN) 75 MG EC tablet Take 1 tablet (75 mg total) by mouth 2 (two) times daily. 06/01/17   Vanessa Kick, MD  doxycycline (VIBRAMYCIN) 100 MG capsule Take 1 capsule (100 mg total) by mouth 2 (two) times daily for 10 days. 03/04/18 03/14/18  [place]ascus Feldpausch C, PA-C  fluticasone (FLONASE) 50 MCG/ACT nasal spray Place 2 sprays into both nostrils daily. 11/02/17   Tasia Catchings, Amy V, PA-C  furosemide (LASIX) 20 MG tablet Take 20 mg by mouth daily as needed for fluid or edema.     [provider]  Ibuprofen 200 MG CAPS Take 1 capsule by mouth 3 (three) times daily as needed (cold symptoms).    [provider]  ipratropium (ATROVENT) 0.06 % nasal spray Place 2 sprays into both nostrils 4 (four) times daily. 11/02/17   Ok Edwards, PA-C  meclizine (ANTIVERT) 25 MG tablet Take 1 tablet (25 mg total) by mouth 3 (three) times daily as needed for dizziness. Patient not taking: Reported on 09/26/2016 10/29/15   Davonna Belling, MD  montelukast (SINGULAIR) 10 MG tablet Take 10 mg by mouth daily.     [provider]  Multiple Vitamin (MULTIVITAMIN WITH MINERALS)  TABS tablet Take 1 tablet by mouth daily.    [provider]  Omega-3 Fatty Acids (FISH OIL) 1000 MG CAPS Take 1 capsule by mouth daily.    [provider]  traMADol (ULTRAM) 50 MG tablet Take 1 tablet (50 mg total) by mouth every 6 (six) hours as needed. Patient not taking: Reported on 01/16/2017 11/01/16   Orpah Greek, MD  vitamin B-12 (CYANOCOBALAMIN) 500 MCG tablet Take 500 mcg by mouth daily.    [provider]    Family History Family History  Problem Relation Age of Onset  . Hypertension Father     Social History Social History   Tobacco Use  . Smoking status: Former Smoker    Packs/day: 0.30    Years: 20.00    Pack years: 6.00    Types: Cigarettes     Last attempt to quit: 02/24/2011    Years since quitting: 7.0  . Smokeless tobacco: Never Used  Substance Use Topics  . Alcohol use: Yes    Comment: occasionally  . Drug use: No     Allergies   Methocarbamol and Penicillins   Review of Systems Review of Systems  Constitutional: Negative for fatigue and fever.  Respiratory: Negative for shortness of breath.   Cardiovascular: Negative for chest pain.  Gastrointestinal: Negative for nausea and vomiting.  Musculoskeletal: Negative for arthralgias and myalgias.  Skin: Positive for color change. Negative for rash and wound.       Abscess  Neurological: Negative for dizziness, weakness, light-headedness, numbness and headaches.     Physical Exam Triage Vital Signs ED Triage Vitals [03/04/18 1542]  Enc Vitals Group     BP 128/69     Pulse Rate 80     Resp 16     Temp 98.2 F (36.8 C)     Temp src      SpO2 100 %     Weight      Height      Head Circumference      Peak Flow      Pain Score      Pain Loc      Pain Edu?      Excl. in [place]?    No data found.  Updated Vital Signs BP 128/69   Pulse 80   Temp 98.2 F (36.8 C)   Resp 16   SpO2 100%   Visual Acuity Right Eye Distance:   Left Eye Distance:   Bilateral Distance:    Right Eye Near:   Left Eye Near:    Bilateral Near:     Physical Exam  Constitutional: She is oriented to person, place, and time. She appears well-developed and well-nourished. No distress.  HENT:  Head: Normocephalic and atraumatic.  Eyes: Conjunctivae are normal.  Neck: Neck supple.  Cardiovascular: Normal rate.  No murmur heard. Pulmonary/Chest: Effort normal. No respiratory distress.  Musculoskeletal: She exhibits no edema.  Neurological: She is alert and oriented to person, place, and time.  Skin: Skin is warm and dry.  2 areas of induration to left axilla, inferior abscess appears more fluctuant than superior.  Significant tenderness to palpation.  Psychiatric: She has a  normal mood and affect.  Nursing note and vitals reviewed.    UC Treatments / Results  Labs (all labs ordered are listed, but only abnormal results are displayed) Labs Reviewed - No data to display  EKG None  Radiology No results found.  Procedures Incision and Drainage Date/Time: 03/05/2018 9:02 AM  Performed by: Braedon Sjogren, [place] C, PA-C Authorized by: Evlyn Amason, Elesa Hacker, PA-C   Consent:    Consent obtained:  Verbal   Consent given by:  Patient   Risks discussed:  Incomplete drainage, bleeding, pain and damage to other organs   Alternatives discussed:  No treatment and alternative treatment Location:    Type:  Abscess   Location:  Upper extremity   Upper extremity location:  Shoulder   Shoulder location:  R shoulder Pre-procedure details:    Skin preparation:  Betadine Anesthesia (see MAR for exact dosages):    Anesthesia method:  Local infiltration   Local anesthetic:  Lidocaine 2% WITH epi Procedure type:    Complexity:  Simple Procedure details:    Incision types:  Single straight   Incision depth:  Subcutaneous   Scalpel blade:  11   Drainage:  Bloody and purulent   Drainage amount: Mild.   Wound treatment:  Wound left open   Packing materials:  None Post-procedure details:    Patient tolerance of procedure:  Tolerated well, no immediate complications Comments:     Pustular drainage obtained from inferior abscess, only bloody drainage obtained from superior abscess   (including critical care time)  Medications Ordered in UC Medications - No data to display  Initial Impression / Assessment and Plan / UC Course  I have reviewed the triage vital signs and the nursing notes.  Pertinent labs & imaging results that were available during my care of the patient were reviewed by me and considered in my medical decision making (see chart for details).     Patient with axilla abscess, I&D performed.  Will send home with doxycycline and continue warm  compresses/soaks with massage to express further drainage.Discussed strict return precautions. Patient verbalized understanding and is agreeable with plan.  Final Clinical Impressions(s) / UC Diagnoses   Final diagnoses:  Abscess of axilla, right     Discharge Instructions     Use anti-inflammatories for pain/swelling. You may take up to 800 mg Ibuprofen every 8 hours with food. You may supplement Ibuprofen with Tylenol [phone] mg every 8 hours.   Doxycycline twice daily for 10 days  Continue warm compresses and massage to area to express further drainage  Return if symptoms not resolving   ED Prescriptions    Medication Sig Dispense Auth. Provider   doxycycline (VIBRAMYCIN) 100 MG capsule Take 1 capsule (100 mg total) by mouth 2 (two) times daily for 10 days. 20 capsule Antonio Woodhams C, PA-C     Controlled Substance Prescriptions [place] Controlled Substance Registry consulted? Not Applicable   Janith Lima, [place] 03/05/18 [id number]

## 2018-10-24 ENCOUNTER — Ambulatory Visit (HOSPITAL_COMMUNITY)
Admission: EM | Admit: 2018-10-24 | Discharge: 2018-10-24 | Disposition: A | Discharge status: Home / Self Care | Payer: BLUE CROSS/BLUE SHIELD | Attending: Family Medicine | Admitting: Family Medicine

## 2018-10-24 ENCOUNTER — Encounter (HOSPITAL_COMMUNITY): Payer: Self-pay | Admitting: Emergency Medicine

## 2018-10-24 DIAGNOSIS — N390 Urinary tract infection, site not specified: Secondary | ICD-10-CM | POA: Diagnosis not present

## 2018-10-24 LAB — POCT URINALYSIS DIP (DEVICE)
Glucose, UA: NEGATIVE mg/dL
KETONES UR: NEGATIVE mg/dL
Nitrite: POSITIVE — AB
PH: 6 (ref 5.0–8.0)
Protein, ur: 30 mg/dL — AB
Specific Gravity, Urine: >=1.03 (ref 1.005–1.030)
Urobilinogen, UA: 0.2 mg/dL (ref 0.0–1.0)

## 2018-10-24 MED ORDER — PHENAZOPYRIDINE HCL 200 MG PO TABS: 200.0000 mg | ORAL_TABLET | Freq: Three times a day (TID) | ORAL | 0 refills | Status: DC

## 2018-10-24 MED ORDER — NITROFURANTOIN MONOHYD MACRO 100 MG PO CAPS: 100.0000 mg | ORAL_CAPSULE | Freq: Two times a day (BID) | ORAL | 0 refills | Status: DC

## 2018-10-24 NOTE — Discharge Instructions (Addendum)
Urine culture sent.  We will call you with the results.   Push fluids and get plenty of rest.   Take antibiotic as directed and to completion Take pyridium as prescribed and as needed for symptomatic relief Follow up with PCP to have urine rechecked in 1-2 weeks and/or if symptoms persist  Return here or go to ER if you have any new or worsening symptoms such as fever, worsening abdominal pain, nausea/vomiting, flank pain, etc..Marland Kitchen

## 2018-10-24 NOTE — ED Triage Notes (Signed)
Pt presents to  [facility] for assessment of frequent urination, still feeling like she need to urinate after she's done, painful urination, and lower back pain since Saturday

## 2018-10-24 NOTE — ED Provider Notes (Signed)
[facility]   CC: UTI symptoms  SUBJECTIVE:  Christina Robles is a 53 y.o. female who complains of urinary frequency, and urgency for the past 4 days.  Patient denies a precipitating event, recent sexual encounter, excessive caffeine intake.  Also complains of suprapubic pressure associated with urination.  Has NOT tried OTC medications.  Symptoms are made worse with urination.  Admits to similar symptoms in the past. Complains of dark urine.  Denies fever, chills, nausea, vomiting, abdominal pain, dysuria, flank pain, hematuria.    LMP: No LMP recorded. Patient is postmenopausal.  ROS: As in HPI.  Past Medical History:  Diagnosis Date  . Asthma   . Blood clot in abdominal vein    Past Surgical History:  Procedure Laterality Date  . Blood clot     After a pregnancy 26 years ago  . TUBAL LIGATION     Allergies  Allergen Reactions  . Methocarbamol     "Closed my throat up."   . Penicillins Nausea And Vomiting    Has patient had a PCN reaction causing immediate rash, facial/tongue/throat swelling, SOB or lightheadedness with hypotension: NO Has patient had a PCN reaction causing severe rash involving mucus membranes or skin necrosis: NO Has patient had a PCN reaction that required hospitalization NO Has patient had a PCN reaction occurring within the last 10 years: NO If all of the above answers are "NO", then may proceed with Cephalosporin use.    No current facility-administered medications on file prior to encounter.    Current Outpatient Medications on File Prior to Encounter  Medication Sig Dispense Refill  . albuterol (PROVENTIL HFA;VENTOLIN HFA) 108 (90 Base) MCG/ACT inhaler Inhale 1-2 puffs into the lungs every 6 (six) hours as needed for wheezing or shortness of breath. 1 Inhaler 0  . budesonide-formoterol (SYMBICORT) 80-4.5 MCG/ACT inhaler Inhale 2 puffs into the lungs 2 (two) times daily. 1 Inhaler 3  . cetirizine (ZYRTEC) 10 MG tablet Take 10 mg by mouth at  bedtime.    . fluticasone (FLONASE) 50 MCG/ACT nasal spray Place 2 sprays into both nostrils daily. 1 g 0  . montelukast (SINGULAIR) 10 MG tablet Take 10 mg by mouth daily.     . Multiple Vitamin (MULTIVITAMIN WITH MINERALS) TABS tablet Take 1 tablet by mouth daily.    . Omega-3 Fatty Acids (FISH OIL) 1000 MG CAPS Take 1 capsule by mouth daily.    . vitamin B-12 (CYANOCOBALAMIN) 500 MCG tablet Take 500 mcg by mouth daily.    Marland Kitchen CALCIUM PO Take 1 tablet by mouth daily.     . diclofenac (VOLTAREN) 75 MG EC tablet Take 1 tablet (75 mg total) by mouth 2 (two) times daily. 14 tablet 0  . furosemide (LASIX) 20 MG tablet Take 20 mg by mouth daily as needed for fluid or edema.     . Ibuprofen 200 MG CAPS Take 1 capsule by mouth 3 (three) times daily as needed (cold symptoms).    Marland Kitchen ipratropium (ATROVENT) 0.06 % nasal spray Place 2 sprays into both nostrils 4 (four) times daily. 15 mL 0  . meclizine (ANTIVERT) 25 MG tablet Take 1 tablet (25 mg total) by mouth 3 (three) times daily as needed for dizziness. (Patient not taking: Reported on 09/26/2016) 15 tablet 0  . traMADol (ULTRAM) 50 MG tablet Take 1 tablet (50 mg total) by mouth every 6 (six) hours as needed. (Patient not taking: Reported on 01/16/2017) 15 tablet 0   Social History   Socioeconomic History  .  Marital status: Single    Spouse name: Not on file  . Number of children: 4  . Years of education: Not on file  . Highest education level: Not on file  Occupational History  . 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  Social Needs  . Financial resource strain: Not on file  . Food insecurity:    Worry: Not on file    Inability: Not on file  . Transportation needs:    Medical: Not on file    Non-medical: Not on file  Tobacco Use  . Smoking status: Former Smoker    Packs/day: 0.30    Years: 20.00    Pack years: 6.00    Types: Cigarettes    Last attempt to quit: 02/24/2011    Years since  quitting: 7.6  . Smokeless tobacco: Never Used  Substance and Sexual Activity  . Alcohol use: Yes    Comment: occasionally  . Drug use: No  . Sexual activity: Not on file  Lifestyle  . Physical activity:    Days per week: Not on file    Minutes per session: Not on file  . Stress: Not on file  Relationships  . Social connections:    Talks on phone: Not on file    Gets together: Not on file    Attends religious service: Not on file    Active member of club or organization: Not on file    Attends meetings of clubs or organizations: Not on file    Relationship status: Not on file  . Intimate partner violence:    Fear of current or ex partner: Not on file    Emotionally abused: Not on file    Physically abused: Not on file    Forced sexual activity: Not on file  Other Topics Concern  . Not on file  Social History Narrative  . Not on file   Family History  Problem Relation Age of Onset  . Hypertension Father     OBJECTIVE:  Vitals:   10/24/18 0833  BP: 112/72  Pulse: 72  Resp: 18  Temp: 97.8 F (36.6 C)  TempSrc: Oral   General appearance: AOx3 in no acute distress HEENT: NCAT.  Oropharynx clear.  Lungs: clear to auscultation bilaterally without adventitious breath sounds Heart: regular rate and rhythm.  Radial pulses 2+ symmetrical bilaterally Abdomen: soft; protuberant; non-distended; no tenderness; bowel sounds present; no guarding Back: no CVA tenderness  Extremities: no edema; symmetrical with no gross deformities Skin: warm and dry Neurologic: Ambulates from chair to exam table without difficulty Psychological: alert and cooperative; normal mood and affect  Labs Reviewed  POCT URINALYSIS DIP (DEVICE) - Abnormal; Notable for the following components:      Result Value   Bilirubin Urine SMALL (*)    Hgb urine dipstick MODERATE (*)    Protein, ur 30 (*)    Nitrite POSITIVE (*)    Leukocytes, UA TRACE (*)    All other components within normal limits    URINE CULTURE    ASSESSMENT & PLAN:  1. Lower urinary tract infectious disease     Meds ordered this encounter  Medications  . nitrofurantoin, macrocrystal-monohydrate, (MACROBID) 100 MG capsule    Sig: Take 1 capsule (100 mg total) by mouth 2 (two) times daily.    Dispense:  10 capsule    Refill:  0    Order Specific Question:   Supervising Provider  AnswerRaylene Everts [[id number]]  . phenazopyridine (PYRIDIUM) 200 MG tablet    Sig: Take 1 tablet (200 mg total) by mouth 3 (three) times daily.    Dispense:  6 tablet    Refill:  0    Order Specific Question:   Supervising Provider    Answer:   Raylene Everts [[id number]]   Urine culture sent.  We will call you with the results.   Push fluids and get plenty of rest.   Take antibiotic as directed and to completion Take pyridium as prescribed and as needed for symptomatic relief Follow up with PCP to have urine rechecked in 1-2 weeks and/or if symptoms persist  Return here or go to ER if you have any new or worsening symptoms such as fever, worsening abdominal pain, nausea/vomiting, flank pain, etc...   Outlined signs and symptoms indicating need for more acute intervention. Patient verbalized understanding. After Visit Summary given.     Lestine Box, PA-C 10/24/18 1120

## 2018-10-26 LAB — URINE CULTURE: Culture: >=100000 — AB

## 2018-10-29 ENCOUNTER — Telehealth (HOSPITAL_COMMUNITY): Payer: Self-pay | Admitting: Emergency Medicine

## 2018-10-29 MED ORDER — SULFAMETHOXAZOLE-TRIMETHOPRIM 800-160 MG PO TABS: 1.0000 | ORAL_TABLET | Freq: Two times a day (BID) | ORAL | 0 refills | Status: AC

## 2018-10-29 NOTE — Telephone Encounter (Signed)
Urine culture was positive Klebsiella pneumoniae for resistant to macrobid given at urgent care visit. Prescription for septra  Per protocol sent to pharmacy of choice. Pt called and made aware. Pt educated to follow up if symptoms are not improving. Verbalized understanding.

## 2019-07-22 ENCOUNTER — Other Ambulatory Visit: Payer: Self-pay | Admitting: Obstetrics and Gynecology

## 2019-07-22 DIAGNOSIS — R928 Other abnormal and inconclusive findings on diagnostic imaging of breast: Secondary | ICD-10-CM

## 2019-07-31 ENCOUNTER — Other Ambulatory Visit: Payer: BLUE CROSS/BLUE SHIELD

## 2019-08-07 ENCOUNTER — Other Ambulatory Visit: Payer: Self-pay

## 2019-08-07 ENCOUNTER — Ambulatory Visit
Admission: RE | Admit: 2019-08-07 | Discharge: 2019-08-07 | Disposition: A | Discharge status: Home / Self Care | Payer: BC Managed Care – PPO | Source: Ambulatory Visit | Attending: Obstetrics and Gynecology | Admitting: Obstetrics and Gynecology

## 2019-08-07 DIAGNOSIS — R928 Other abnormal and inconclusive findings on diagnostic imaging of breast: Secondary | ICD-10-CM

## 2020-10-08 ENCOUNTER — Ambulatory Visit (HOSPITAL_COMMUNITY)
Admission: EM | Admit: 2020-10-08 | Discharge: 2020-10-08 | Disposition: A | Discharge status: Home / Self Care | Payer: BC Managed Care – PPO | Attending: Student | Admitting: Student

## 2020-10-08 ENCOUNTER — Encounter (HOSPITAL_COMMUNITY): Payer: Self-pay | Admitting: Emergency Medicine

## 2020-10-08 DIAGNOSIS — M62830 Muscle spasm of back: Secondary | ICD-10-CM | POA: Diagnosis not present

## 2020-10-08 DIAGNOSIS — M5431 Sciatica, right side: Secondary | ICD-10-CM | POA: Diagnosis not present

## 2020-10-08 MED ORDER — HYDROCODONE-ACETAMINOPHEN 5-325 MG PO TABS: 2.0000 | ORAL_TABLET | ORAL | 0 refills | Status: DC | PRN

## 2020-10-08 MED ORDER — CYCLOBENZAPRINE HCL 10 MG PO TABS: 10.0000 mg | ORAL_TABLET | Freq: Two times a day (BID) | ORAL | 0 refills | Status: DC | PRN

## 2020-10-08 MED ORDER — PREDNISONE 10 MG (21) PO TBPK: ORAL_TABLET | Freq: Every day | ORAL | 0 refills | Status: DC

## 2020-10-08 NOTE — ED Provider Notes (Signed)
[facility]    CSN: [id number] Arrival date & time: 10/08/20  1504      History   Chief Complaint Chief Complaint  Patient presents with  . Back Pain    HPI Christina Robles FamManuel is a 55 y.o. female presenting for back pain x3-4 months. History of asthma, CAP, hypertension. states back pain is getting worse and increases with activity. Denies injury/trauma, though states she just worked 7 12-hour shifts in a row. Describes pain as spasmodic and lower right-sided back, radiating down front and back of right thigh. Worsened with walking, prolonged standing, and laying down. Denies sensation changes in extremities, weakness in extremities, bowel/bladder incontinence. She does not have diabetes and has taken prednisone tapers frequently for asthma. Denies hematuria, dysuria, frequency, urgency, n/v/d/abd pain, fevers/chills, abdnormal vaginal discharge. No history of kidney stones.    HPI  Past Medical History:  Diagnosis Date  . Asthma   . 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 SOB (shortness of breath)   . Morbid obesity (HCC)   . Asthma exacerbation 08/16/2014  . Acute respiratory failure with hypoxia (HCC) 08/16/2014  . Hypokalemia 08/16/2014  . Leukocytosis 08/16/2014  . CAP (community acquired pneumonia)   . HTN (hypertension) 09/26/2011    Past Surgical History:  Procedure Laterality Date  . Blood clot     After a pregnancy 26 years ago  . TUBAL LIGATION      OB History   No obstetric history on file.      Home Medications    Prior to Admission medications   Medication Sig Start Date End Date Taking? Authorizing Provider  cyclobenzaprine (FLEXERIL) 10 MG tablet Take 1 tablet (10 mg total) by mouth 2 (two) times daily as needed for muscle spasms. 10/08/20  Yes Rhys MartiniGraham, Awais Cobarrubias E, PA-C  albuterol (PROVENTIL HFA;VENTOLIN HFA) 108 (90 Base) MCG/ACT inhaler Inhale 1-2 puffs into the lungs every 6 (six) hours as  needed for wheezing or shortness of breath. 01/16/17   Arthor CaptainHarris, Abigail, PA-C  budesonide-formoterol (SYMBICORT) 80-4.5 MCG/ACT inhaler Inhale 2 puffs into the lungs 2 (two) times daily. 08/18/14   Vassie LollMadera, Carlos, MD  CALCIUM PO Take 1 tablet by mouth daily.     [provider]  cetirizine (ZYRTEC) 10 MG tablet Take 10 mg by mouth at bedtime.    [provider]  diclofenac (VOLTAREN) 75 MG EC tablet Take 1 tablet (75 mg total) by mouth 2 (two) times daily. 06/01/17   Mardella LaymanHagler, Brian, MD  fluticasone (FLONASE) 50 MCG/ACT nasal spray Place 2 sprays into both nostrils daily. 11/02/17   Cathie HoopsYu, Amy V, PA-C  furosemide (LASIX) 20 MG tablet Take 20 mg by mouth daily as needed for fluid or edema.     [provider]  Ibuprofen 200 MG CAPS Take 1 capsule by mouth 3 (three) times daily as needed (cold symptoms).    [provider]  ipratropium (ATROVENT) 0.06 % nasal spray Place 2 sprays into both nostrils 4 (four) times daily. 11/02/17   Cathie HoopsYu, Amy V, PA-C  montelukast (SINGULAIR) 10 MG tablet Take 10 mg by mouth daily.     [provider]  Multiple Vitamin (MULTIVITAMIN WITH MINERALS) TABS tablet Take 1 tablet by mouth daily.    [provider]  nitrofurantoin, macrocrystal-monohydrate, (MACROBID) 100 MG capsule Take 1 capsule (100 mg total) by mouth 2 (two) times daily. 10/24/18   Rennis HardingWurst, Brittany, PA-C  Omega-3 Fatty  Acids (FISH OIL) 1000 MG CAPS Take 1 capsule by mouth daily.    [provider]  phenazopyridine (PYRIDIUM) 200 MG tablet Take 1 tablet (200 mg total) by mouth 3 (three) times daily. 10/24/18   Wurst, [place], PA-C  vitamin B-12 (CYANOCOBALAMIN) 500 MCG tablet Take 500 mcg by mouth daily.    [provider]    Family History Family History  Problem Relation Age of Onset  . Hypertension Father     Social History Social History   Tobacco Use  . Smoking status: Former Smoker    Packs/day: 0.30    Years: 20.00    Pack years:  6.00    Types: Cigarettes    Quit date: 02/24/2011    Years since quitting: 9.6  . Smokeless tobacco: Never Used  Substance Use Topics  . Alcohol use: Yes    Comment: occasionally  . Drug use: No     Allergies   Methocarbamol and Penicillins   Review of Systems Review of Systems  Genitourinary: Negative for dysuria, flank pain, frequency, hematuria, pelvic pain, urgency, vaginal bleeding and vaginal pain.  Musculoskeletal: Positive for back pain.  All other systems reviewed and are negative.    Physical Exam Triage Vital Signs ED Triage Vitals  Enc Vitals Group     BP 10/08/20 1653 (!) 163/95     Pulse Rate 10/08/20 1653 80     Resp 10/08/20 1653 20     Temp 10/08/20 1653 97.6 F (36.4 C)     Temp src --      SpO2 10/08/20 1653 100 %     Weight --      Height --      Head Circumference --      Peak Flow --      Pain Score 10/08/20 1654 9     Pain Loc --      Pain Edu? --      Excl. in [place]? --    No data found.  Updated Vital Signs BP (!) 163/95 (BP Location: Left Arm)   Pulse 80   Temp 97.6 F (36.4 C)   Resp 20   SpO2 100%   Visual Acuity Right Eye Distance:   Left Eye Distance:   Bilateral Distance:    Right Eye Near:   Left Eye Near:    Bilateral Near:     Physical Exam Vitals reviewed.  Constitutional:      General: She is not in acute distress.    Appearance: Normal appearance. She is not ill-appearing.  HENT:     Head: Normocephalic and atraumatic.  Cardiovascular:     Rate and Rhythm: Normal rate and regular rhythm.     Heart sounds: Normal heart sounds.  Pulmonary:     Effort: Pulmonary effort is normal.     Breath sounds: Normal breath sounds and air entry.  Abdominal:     Tenderness: There is no abdominal tenderness. There is no right CVA tenderness, left CVA tenderness, guarding or rebound.     Comments: No bowel or bladder incontinence.  Musculoskeletal:     Cervical back: Normal range of motion. No swelling, deformity, signs  of trauma, rigidity, spasms, tenderness, bony tenderness or crepitus. No pain with movement.     Thoracic back: No swelling, deformity, signs of trauma, spasms, tenderness or bony tenderness. Normal range of motion. No scoliosis.     Lumbar back: Spasms and tenderness present. No swelling, deformity, signs of trauma or bony tenderness. Normal  range of motion. Positive right straight leg raise test. Negative left straight leg raise test. No scoliosis.     Comments: Strength 5/5 in UEs and LEs. No spinous process tenderness. R lumbar paraspinous tenderness to palpation. Pain with flexion of spine and walking. Gait intact. Sensation intact LEs.   Neurological:     General: No focal deficit present.     Mental Status: She is alert.     Cranial Nerves: No cranial nerve deficit.     Comments: Strength 5/5 in UEs and LEs. Gait normal. Sensation intact in UEs and LEs.   Psychiatric:        Mood and Affect: Mood normal.        Behavior: Behavior normal.        Thought Content: Thought content normal.        Judgment: Judgment normal.      UC Treatments / Results  Labs (all labs ordered are listed, but only abnormal results are displayed) Labs Reviewed - No data to display  EKG   Radiology No results found.  Procedures Procedures (including critical care time)  Medications Ordered in UC Medications - No data to display  Initial Impression / Assessment and Plan / UC Course  I have reviewed the triage vital signs and the nursing notes.  Pertinent labs & imaging results that were available during my care of the patient were reviewed by me and considered in my medical decision making (see chart for details).     PDMP reviewed. 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 Script for hydrocodone x10 tabs sent. She understands not to take this medication and drive, operatve machinery, drink alcohol, etc. Script  for prednisone sent. Pt is not diabetic. Continue using heat.   Return precautions- weakness/sensation changes in extremities, bowel/bladder incontinence, chest pain, shortness of breath, etc.   Final Clinical Impressions(s) / UC Diagnoses   Final diagnoses:  None   Discharge Instructions   None    ED Prescriptions    Medication Sig Dispense Auth. Provider   cyclobenzaprine (FLEXERIL) 10 MG tablet Take 1 tablet (10 mg total) by mouth 2 (two) times daily as needed for muscle spasms. 20 tablet Rhys Martini, PA-C     I have reviewed the PDMP during this encounter.   Rhys Martini, PA-C 10/08/20 1759

## 2020-10-08 NOTE — ED Triage Notes (Signed)
Pt c/o constant pain onset 3-4 months ... reports pain start from right thigh area and radiates to back  Pain has steadily been getting worse and increases w/activity   Denies inj/trauma  Taking Ibuprofen w/no relief.   A&O x4... NAD.Marland Kitchen. ambulatory

## 2020-10-08 NOTE — Discharge Instructions (Signed)
Hydrocodone for pain. Don't take this and drive, operate machinery, or drink alcohol.  Prednisone taper.  Continue heat, rest, stretching exercises.

## 2021-07-25 ENCOUNTER — Encounter (HOSPITAL_COMMUNITY): Payer: Self-pay

## 2021-07-25 ENCOUNTER — Other Ambulatory Visit: Payer: Self-pay

## 2021-07-25 ENCOUNTER — Emergency Department (HOSPITAL_COMMUNITY)
Admission: EM | Admit: 2021-07-25 | Discharge: 2021-07-25 | Disposition: A | Discharge status: Home / Self Care | Payer: BC Managed Care – PPO | Attending: Emergency Medicine | Admitting: Emergency Medicine

## 2021-07-25 DIAGNOSIS — J45909 Unspecified asthma, uncomplicated: Secondary | ICD-10-CM | POA: Diagnosis not present

## 2021-07-25 DIAGNOSIS — R220 Localized swelling, mass and lump, head: Secondary | ICD-10-CM

## 2021-07-25 DIAGNOSIS — Z87891 Personal history of nicotine dependence: Secondary | ICD-10-CM | POA: Insufficient documentation

## 2021-07-25 DIAGNOSIS — I10 Essential (primary) hypertension: Secondary | ICD-10-CM | POA: Diagnosis not present

## 2021-07-25 MED ORDER — FAMOTIDINE 20 MG PO TABS
40.0000 mg | ORAL_TABLET | Freq: Once | ORAL | Status: AC
  Administered 2021-07-25: 40 mg via ORAL
  Filled 2021-07-25: qty 2

## 2021-07-25 MED ORDER — PREDNISONE 10 MG (21) PO TBPK: ORAL_TABLET | Freq: Every day | ORAL | 0 refills | Status: DC

## 2021-07-25 MED ORDER — METHYLPREDNISOLONE SODIUM SUCC 125 MG IJ SOLR
125.0000 mg | Freq: Once | INTRAMUSCULAR | Status: AC
  Administered 2021-07-25: 125 mg via INTRAMUSCULAR

## 2021-07-25 MED ORDER — DIPHENHYDRAMINE HCL 25 MG PO CAPS
50.0000 mg | ORAL_CAPSULE | Freq: Once | ORAL | Status: AC
  Administered 2021-07-25: 50 mg via ORAL
  Filled 2021-07-25: qty 2

## 2021-07-25 MED ORDER — METHYLPREDNISOLONE SODIUM SUCC 125 MG IJ SOLR
125.0000 mg | Freq: Once | INTRAMUSCULAR | Status: DC
  Filled 2021-07-25: qty 2

## 2021-07-25 NOTE — ED Provider Notes (Signed)
[facility] DEPT Provider Note   CSN: [id number] Arrival date & time: 07/25/21  2018     History Chief Complaint  Patient presents with   Allergic Reaction    Christina Robles is a 55 y.o. female.  55 year old female presents with concern for allergic reaction.  Patient states that she was eating a sandwich and some chips and began to feel at her lips were swelling.  She denies difficulty breathing or swallowing, vomiting, hives or abdominal pain.  Patient took Benadryl at home yesterday when she first noticed swelling of her lips and it seemed to help, took again today.  Patient states that she has multiple medication allergies, no known food allergies however is going to follow-up with her doctor to discuss allergy testing.  She denies taking any ACE inhibitor's.  Also reports feeling she has some swelling of her upper back.  No other complaints or concerns.      Past Medical History:  Diagnosis Date   Asthma    Blood clot in abdominal vein     Patient Active Problem List   Diagnosis Date Noted   SOB (shortness of breath)    Morbid obesity ([place])    Asthma exacerbation 08/16/2014   Acute respiratory failure with hypoxia ([place]) 08/16/2014   Hypokalemia 08/16/2014   Leukocytosis 08/16/2014   CAP (community acquired pneumonia)    HTN (hypertension) 09/26/2011    Past Surgical History:  Procedure Laterality Date   Blood clot     After a pregnancy 26 years ago   TUBAL LIGATION       OB History   No obstetric history on file.     Family History  Problem Relation Age of Onset   Hypertension Father     Social History   Tobacco Use   Smoking status: Former    Packs/day: 0.30    Years: 20.00    Pack years: 6.00    Types: Cigarettes    Quit date: 02/24/2011    Years since quitting: 10.4   Smokeless tobacco: Never  Substance Use Topics   Alcohol use: Yes    Comment: occasionally   Drug use: No    Home Medications Prior to  Admission medications   Medication Sig Start Date End Date Taking? Authorizing Provider  predniSONE (STERAPRED UNI-PAK 21 TAB) 10 MG (21) TBPK tablet Take by mouth daily. Take 6 tabs by mouth daily  for 2 days, then 5 tabs for 2 days, then 4 tabs for 2 days, then 3 tabs for 2 days, 2 tabs for 2 days, then 1 tab by mouth daily for 2 days 07/25/21  Yes Tacy Learn, PA-C  albuterol (PROVENTIL HFA;VENTOLIN HFA) 108 (90 Base) MCG/ACT inhaler Inhale 1-2 puffs into the lungs every 6 (six) hours as needed for wheezing or shortness of breath. 01/16/17   Margarita Mail, PA-C  budesonide-formoterol (SYMBICORT) 80-4.5 MCG/ACT inhaler Inhale 2 puffs into the lungs 2 (two) times daily. 08/18/14   Barton Dubois, MD  CALCIUM PO Take 1 tablet by mouth daily.     [provider]  cetirizine (ZYRTEC) 10 MG tablet Take 10 mg by mouth at bedtime.    [provider]  diclofenac (VOLTAREN) 75 MG EC tablet Take 1 tablet (75 mg total) by mouth 2 (two) times daily. 06/01/17   Vanessa Kick, MD  fluticasone (FLONASE) 50 MCG/ACT nasal spray Place 2 sprays into both nostrils daily. 11/02/17   Tasia Catchings, Amy V, PA-C  furosemide (LASIX) 20 MG tablet  Take 20 mg by mouth daily as needed for fluid or edema.     [provider]  HYDROcodone-acetaminophen (NORCO/VICODIN) 5-325 MG tablet Take 2 tablets by mouth every 4 (four) hours as needed. 10/08/20   Hazel Sams, PA-C  Ibuprofen 200 MG CAPS Take 1 capsule by mouth 3 (three) times daily as needed (cold symptoms).    [provider]  ipratropium (ATROVENT) 0.06 % nasal spray Place 2 sprays into both nostrils 4 (four) times daily. 11/02/17   Tasia Catchings, Amy V, PA-C  montelukast (SINGULAIR) 10 MG tablet Take 10 mg by mouth daily.     [provider]  Multiple Vitamin (MULTIVITAMIN WITH MINERALS) TABS tablet Take 1 tablet by mouth daily.    [provider]  nitrofurantoin, macrocrystal-monohydrate, (MACROBID) 100 MG capsule Take 1 capsule (100 mg  total) by mouth 2 (two) times daily. 10/24/18   Wurst, [place], PA-C  Omega-3 Fatty Acids (FISH OIL) 1000 MG CAPS Take 1 capsule by mouth daily.    [provider]  phenazopyridine (PYRIDIUM) 200 MG tablet Take 1 tablet (200 mg total) by mouth 3 (three) times daily. 10/24/18   Wurst, [place], PA-C  vitamin B-12 (CYANOCOBALAMIN) 500 MCG tablet Take 500 mcg by mouth daily.    [provider]    Allergies    Methocarbamol, Cyclobenzaprine, Meloxicam, and Penicillins  Review of Systems   Review of Systems  Constitutional:  Negative for chills and fever.  HENT:  Negative for trouble swallowing and voice change.   Respiratory:  Negative for cough, shortness of breath and wheezing.   Gastrointestinal:  Negative for abdominal pain, nausea and vomiting.  Musculoskeletal:  Negative for arthralgias and myalgias.  Skin:  Negative for rash and wound.  Allergic/Immunologic: Negative for immunocompromised state.  Neurological:  Negative for weakness.  Hematological:  Negative for adenopathy.  Psychiatric/Behavioral:  Negative for confusion.   All other systems reviewed and are negative.  Physical Exam Updated Vital Signs BP (!) 141/81   Pulse 81   Temp 98 F (36.7 C) (Oral)   Resp 18   Ht 5\' 2"  (1.575 m)   Wt 117.9 kg   SpO2 97%   BMI 47.55 kg/m   Physical Exam Vitals and nursing note reviewed.  Constitutional:      General: She is not in acute distress.    Appearance: She is well-developed. She is not diaphoretic.  HENT:     Head: Normocephalic and atraumatic.     Mouth/Throat:     Mouth: Mucous membranes are moist.     Pharynx: No oropharyngeal exudate or posterior oropharyngeal erythema.  Eyes:     Conjunctiva/sclera: Conjunctivae normal.  Cardiovascular:     Rate and Rhythm: Normal rate and regular rhythm.     Heart sounds: Normal heart sounds.  Pulmonary:     Effort: Pulmonary effort is normal.     Breath sounds: Normal breath sounds.  Musculoskeletal:         General: No swelling or tenderness.     Cervical back: Neck supple.  Skin:    General: Skin is warm and dry.     Findings: No erythema, lesion or rash.  Neurological:     Mental Status: She is alert and oriented to person, place, and time.  Psychiatric:        Behavior: Behavior normal.    ED Results / Procedures / Treatments   Labs (all labs ordered are listed, but only abnormal results are displayed) Labs Reviewed - No  data to display  EKG None  Radiology No results found.  Procedures Procedures   Medications Ordered in ED Medications  diphenhydrAMINE (BENADRYL) capsule 50 mg (has no administration in time range)  famotidine (PEPCID) tablet 40 mg (has no administration in time range)  methylPREDNISolone sodium succinate (SOLU-MEDROL) 125 mg/2 mL injection 125 mg (has no administration in time range)    ED Course  I have reviewed the triage vital signs and the nursing notes.  Pertinent labs & imaging results that were available during my care of the patient were reviewed by me and considered in my medical decision making (see chart for details).  Clinical Course as of 07/25/21 2246  Nancy Fetter Oct [birth year], 732  [id number] 55 year old female with concern for allergic reaction to something she ate as above.  Patient does not have symptoms concerning for anaphylaxis at this time.  There is no appreciable swelling.  Recommend that she continue with Benadryl, can add Pepcid to this, will treat with prednisone taper and have her follow-up with her PCP for further discussion.  Recommend maintaining a food journal to help identify sources. [LM]    Clinical Course User Index [LM] Roque Lias   MDM Rules/Calculators/A&P                           Final Clinical Impression(s) / ED Diagnoses Final diagnoses:  Facial swelling    Rx / DC Orders ED Discharge Orders          Ordered    predniSONE (STERAPRED UNI-PAK 21 TAB) 10 MG (21) TBPK tablet  Daily        07/25/21 2246              Tacy Learn, PA-C 07/25/21 2246    Drenda Freeze, MD 07/25/21 2303

## 2021-07-25 NOTE — ED Provider Notes (Signed)
Emergency Medicine Provider Triage Evaluation Note  Christina Robles , a 55 y.o. female  was evaluated in triage.  Pt complains of lip swelling since yesterday.  Patient ate something and began having facial swelling.  No trouble swallowing, trouble breathing, abdominal pain, nausea, vomiting, diarrhea.  Took Benadryl yesterday and today with little improvement.  Review of Systems  Positive:  Negative:   Physical Exam  BP (!) 141/81   Pulse 81   Temp 98 F (36.7 C) (Oral)   Resp 18   Ht 5\' 2"  (1.575 m)   Wt 117.9 kg   SpO2 97%   BMI 47.55 kg/m  Gen:   Awake, no distress   Resp:  Normal effort  MSK:   Moves extremities without difficulty  Other:  No pharyngeal erythema or edema.  Uvula is midline.  Talking complete sentences.  Medical Decision Making  Medically screening exam initiated at 9:36 PM.  Appropriate orders placed.  Christina Robles was informed that the remainder of the evaluation will be completed by another provider, this initial triage assessment does not replace that evaluation, and the importance of remaining in the ED until their evaluation is complete.     Hendricks Limes, PA-C 07/25/21 2138    Jeanell Sparrow, DO 07/25/21 2342

## 2021-07-25 NOTE — Discharge Instructions (Signed)
Take prednisone taper as prescribed starting tomorrow.  Continue with Benadryl, Pepcid, Zyrtec.  Return to ER for worsening or concerning symptoms otherwise follow-up with your doctor as discussed for testing.  Maintain food journal to help identify potential sources.

## 2021-07-25 NOTE — ED Triage Notes (Signed)
Pt reports allergic reaction to unknown foor yesterday. Admits mild facial swelling and tingling. Denies any other sx. Took benadryl this morning with improvement.

## 2021-09-16 ENCOUNTER — Encounter: Payer: Self-pay | Admitting: Gastroenterology

## 2021-10-12 ENCOUNTER — Other Ambulatory Visit: Payer: Self-pay | Admitting: Obstetrics and Gynecology

## 2021-10-12 DIAGNOSIS — R928 Other abnormal and inconclusive findings on diagnostic imaging of breast: Secondary | ICD-10-CM

## 2021-10-25 ENCOUNTER — Ambulatory Visit (INDEPENDENT_AMBULATORY_CARE_PROVIDER_SITE_OTHER): Payer: BC Managed Care – PPO | Admitting: Gastroenterology

## 2021-10-25 ENCOUNTER — Encounter: Payer: Self-pay | Admitting: Gastroenterology

## 2021-10-25 VITALS — BP 128/84 | HR 78 | Ht 62.0 in | Wt 279.5 lb

## 2021-10-25 DIAGNOSIS — Z1212 Encounter for screening for malignant neoplasm of rectum: Secondary | ICD-10-CM

## 2021-10-25 DIAGNOSIS — R1310 Dysphagia, unspecified: Secondary | ICD-10-CM

## 2021-10-25 DIAGNOSIS — R1319 Other dysphagia: Secondary | ICD-10-CM

## 2021-10-25 DIAGNOSIS — Z1211 Encounter for screening for malignant neoplasm of colon: Secondary | ICD-10-CM

## 2021-10-25 NOTE — Patient Instructions (Signed)
If you are age 54 or older, your body mass index should be between 23-30. Your Body mass index is 51.12 kg/m. If this is out of the aforementioned range listed, please consider follow up with your Primary Care Provider.  If you are age 83 or younger, your body mass index should be between 19-25. Your Body mass index is 51.12 kg/m. If this is out of the aformentioned range listed, please consider follow up with your Primary Care Provider.   We will call you to a schedule Hospital procedure.  The [facility] providers would like to encourage you to use [facility] to communicate with providers for non-urgent requests or questions.  Due to long hold times on the telephone, sending your provider a message by [facility] may be a faster and more efficient way to get a response.  Please allow 48 business hours for a response.  Please remember that this is for non-urgent requests.   It was a pleasure to see you today!  Thank you for trusting me with your gastrointestinal care!    Scott E.Candis Schatz, MD

## 2021-10-25 NOTE — Progress Notes (Signed)
HPI : Christina Robles is a pleasant 56 year old female with asthma and morbid obesity who is referred to [place] by Selina Cooley, FNP for colon cancer screening.  She has never had any prior colon cancer screening.  She denies any lower GI symptoms such as abdominal pain, constipation, diarrhea or blood in her stool.  She denies any family history of colon cancer. She reports that she has had swallowing problems for many years.  She states she underwent a procedure many years ago to evaluate this which sounds like it was esophageal manometry.  She did recalls having a catheter placed through her nose.  She recalls being diagnosed with a hiatal hernia based on the study.  She describes a sensation of food getting stuck in her chest when she swallows.  All this is been going on for a long time, she says it seems to be more noticeable in the last year.  She has had to bring up food that got stuck and would not go down all the way.  This is infrequent, maybe twice a month.  She denies symptoms of heartburn or acid regurgitation, but she does describe a sensation of chest "fullness" and excessive belching. She denies symptoms of chest pain/pressure, dyspnea, palpitations.  She exercises at [facility] and [employer] 2-3x/week.  She walks on the treadmill, stairmaster and rowing machine..   Past Medical History:  Diagnosis Date   Asthma    Blood clot in abdominal vein      Past Surgical History:  Procedure Laterality Date   Blood clot     After a pregnancy 26 years ago   TUBAL LIGATION     Family History  Problem Relation Age of Onset   Hypertension Father    Social History   Tobacco Use   Smoking status: Former    Packs/day: 0.30    Years: 20.00    Pack years: 6.00    Types: Cigarettes    Quit date: 02/24/2011    Years since quitting: 10.6   Smokeless tobacco: Never  Substance Use Topics   Alcohol use: Yes    Comment: occasionally   Drug use: No   Current Outpatient Medications   Medication Sig Dispense Refill   albuterol (PROVENTIL HFA;VENTOLIN HFA) 108 (90 Base) MCG/ACT inhaler Inhale 1-2 puffs into the lungs every 6 (six) hours as needed for wheezing or shortness of breath. 1 Inhaler 0   budesonide-formoterol (SYMBICORT) 80-4.5 MCG/ACT inhaler Inhale 2 puffs into the lungs 2 (two) times daily. 1 Inhaler 3   cetirizine (ZYRTEC) 10 MG tablet Take 10 mg by mouth at bedtime.     diclofenac (VOLTAREN) 75 MG EC tablet Take 1 tablet (75 mg total) by mouth 2 (two) times daily. 14 tablet 0   fluticasone (FLONASE) 50 MCG/ACT nasal spray Place 2 sprays into both nostrils daily. 1 g 0   furosemide (LASIX) 20 MG tablet Take 20 mg by mouth daily as needed for fluid or edema.      HYDROcodone-acetaminophen (NORCO/VICODIN) 5-325 MG tablet Take 2 tablets by mouth every 4 (four) hours as needed. 10 tablet 0   Ibuprofen 200 MG CAPS Take 1 capsule by mouth 3 (three) times daily as needed (cold symptoms).     ipratropium (ATROVENT) 0.06 % nasal spray Place 2 sprays into both nostrils 4 (four) times daily. 15 mL 0   montelukast (SINGULAIR) 10 MG tablet Take 10 mg by mouth daily.      Multiple Vitamin (MULTIVITAMIN WITH  MINERALS) TABS tablet Take 1 tablet by mouth daily.     nitrofurantoin, macrocrystal-monohydrate, (MACROBID) 100 MG capsule Take 1 capsule (100 mg total) by mouth 2 (two) times daily. 10 capsule 0   Omega-3 Fatty Acids (FISH OIL) 1000 MG CAPS Take 1 capsule by mouth daily.     phenazopyridine (PYRIDIUM) 200 MG tablet Take 1 tablet (200 mg total) by mouth 3 (three) times daily. 6 tablet 0   predniSONE (STERAPRED UNI-PAK 21 TAB) 10 MG (21) TBPK tablet Take by mouth daily. Take 6 tabs by mouth daily  for 2 days, then 5 tabs for 2 days, then 4 tabs for 2 days, then 3 tabs for 2 days, 2 tabs for 2 days, then 1 tab by mouth daily for 2 days 42 tablet 0   vitamin B-12 (CYANOCOBALAMIN) 500 MCG tablet Take 500 mcg by mouth daily.     No current facility-administered medications for  this visit.   Allergies  Allergen Reactions   Methocarbamol Anaphylaxis    "Closed my throat up."  "Closed my throat up."    Cyclobenzaprine Nausea And Vomiting   Meloxicam Nausea And Vomiting    "Closed my throat up."    Penicillins Nausea And Vomiting and Other (See Comments)    Has patient had a PCN reaction causing immediate rash, facial/tongue/throat swelling, SOB or lightheadedness with hypotension: NO Has patient had a PCN reaction causing severe rash involving mucus membranes or skin necrosis: NO Has patient had a PCN reaction that required hospitalization NO Has patient had a PCN reaction occurring within the last 10 years: NO If all of the above answers are "NO", then may proceed with Cephalosporin use.  Has patient had a PCN reaction causing immediate rash, facial/tongue/throat swelling, SOB or lightheadedness with hypotension: NO Has patient had a PCN reaction causing severe rash involving mucus membranes or skin necrosis: NO Has patient had a PCN reaction that required hospitalization NO Has patient had a PCN reaction occurring within the last 10 years: NO If all of the above answers are "NO", then may proceed with Cephalosporin use.      Review of Systems: All systems reviewed and negative except where noted in HPI.    No results found.  Physical Exam: Ht 5\' 2"  (1.575 m)    Wt 279 lb 8 oz (126.8 kg)    BMI 51.12 kg/m  Constitutional: Pleasant,well-developed, obese African-American female in no acute distress. HEENT: Normocephalic and atraumatic. Conjunctivae are normal. No scleral icterus.  Alexa plan Cardiovascular: Normal rate, regular rhythm.  Pulmonary/chest: Effort normal and breath sounds normal. No wheezing, rales or rhonchi. Abdominal: Soft, nondistended, nontender. Bowel sounds active throughout. There are no masses palpable. No hepatomegaly. Extremities: no edema Neurological: Alert and oriented to person place and time. Skin: Skin is warm and dry.  No rashes noted. Psychiatric: Normal mood and affect. Behavior is normal.  CBC    Component Value Date/Time   WBC 9.7 01/16/2017 1818   RBC 4.86 01/16/2017 1818   HGB 13.3 01/16/2017 1818   HCT 38.0 01/16/2017 1818   PLT 248 01/16/2017 1818   MCV 78.2 01/16/2017 1818   MCH 27.4 01/16/2017 1818   MCHC 35.0 01/16/2017 1818   RDW 15.0 01/16/2017 1818   LYMPHSABS 3.0 11/01/2016 0400   MONOABS 0.6 11/01/2016 0400   EOSABS 0.4 11/01/2016 0400   BASOSABS 0.0 11/01/2016 0400    CMP     Component Value Date/Time   NA 139 01/16/2017 1818   K 3.4 (L)  01/16/2017 1818   CL 105 01/16/2017 1818   CO2 25 01/16/2017 1818   GLUCOSE 95 01/16/2017 1818   BUN 18 01/16/2017 1818   CREATININE 0.85 01/16/2017 1818   CALCIUM 9.1 01/16/2017 1818   PROT 7.5 08/17/2014 0518   ALBUMIN 3.6 08/17/2014 0518   AST 24 08/17/2014 0518   ALT 33 08/17/2014 0518   ALKPHOS 112 08/17/2014 0518   BILITOT <0.2 (L) 08/17/2014 0518   GFRNONAA >60 01/16/2017 1818   GFRAA >60 01/16/2017 1818     ASSESSMENT AND PLAN: 56 year old female with morbid obesity and asthma due for initial average risk colon cancer screening.  She denies any chronic lower GI symptoms or family history of colon cancer.  She does have a chronic history of solid dysphagia and atypical GERD symptoms.  It sounds like she underwent an esophageal manometry in the past, but denies ever having an upper endoscopy.  An upper endoscopy is warranted to evaluate her dysphagia and potentially dilate any stricture or stenosis present.  We will get the patient scheduled for an upper and lower endoscopy.  Due to the patient's BMI, this will need to be done in the hospital setting.  As the patient is engaged in regular exercise regimen, if she were to lose enough weight to get her BMI down below 50, we would be able to support her at the [facility].  It will be many months before my next available hospital endoscopy  availability.  Dysphagia -Diagnostic/therapeutic EGD  Colon cancer screening -Colonoscopy  Both procedures will be performed in the hospital setting due to patient's BMI  The details, risks (including bleeding, perforation, infection, missed lesions, medication reactions and possible hospitalization or surgery if complications occur), benefits, and alternatives to colonoscopy with possible biopsy, dilation and polypectomy were discussed with the patient and she consents to proceed.   Ledora Delker E. Candis Schatz, MD [facility]  CC:  Vonna Drafts, FNP

## 2021-11-23 ENCOUNTER — Ambulatory Visit
Admission: RE | Admit: 2021-11-23 | Discharge: 2021-11-23 | Disposition: A | Discharge status: Home / Self Care | Payer: BC Managed Care – PPO | Source: Ambulatory Visit | Attending: Obstetrics and Gynecology | Admitting: Obstetrics and Gynecology

## 2021-11-23 ENCOUNTER — Ambulatory Visit: Payer: BC Managed Care – PPO

## 2021-11-23 DIAGNOSIS — R928 Other abnormal and inconclusive findings on diagnostic imaging of breast: Secondary | ICD-10-CM

## 2022-04-14 ENCOUNTER — Other Ambulatory Visit: Payer: Self-pay

## 2022-04-14 ENCOUNTER — Telehealth: Payer: Self-pay

## 2022-04-14 DIAGNOSIS — R1319 Other dysphagia: Secondary | ICD-10-CM

## 2022-04-14 NOTE — Telephone Encounter (Signed)
Pt scheduled for EGD at [facility] 04/19/22 at 9:15am with Dr. Carlean Purl. Pt to arrive there at 7:45am. Pt to be NPO after midnight. Pt aware of appt and amb ref in epic.

## 2022-04-18 ENCOUNTER — Telehealth: Payer: Self-pay | Admitting: Internal Medicine

## 2022-04-18 NOTE — Telephone Encounter (Signed)
Procedure reviewed with Pt. Pt verbalized understanding with all questions answered.

## 2022-04-18 NOTE — Telephone Encounter (Signed)
Inbound call from patient stating that she is scheduled to have a procedure with Dr. Carlean Purl tomorrow at the hospital. Patient stated she would like to speak with him about the procedure and does not want to go into the procedure blind sided. I advised patient I could send a message to the nurse. Please advise.

## 2022-04-19 ENCOUNTER — Encounter (HOSPITAL_COMMUNITY): Payer: Self-pay | Admitting: Internal Medicine

## 2022-04-19 ENCOUNTER — Ambulatory Visit (HOSPITAL_COMMUNITY): Payer: BC Managed Care – PPO | Admitting: Anesthesiology

## 2022-04-19 ENCOUNTER — Other Ambulatory Visit: Payer: Self-pay

## 2022-04-19 ENCOUNTER — Ambulatory Visit (HOSPITAL_COMMUNITY)
Admission: RE | Admit: 2022-04-19 | Discharge: 2022-04-19 | Disposition: A | Discharge status: Home / Self Care | Payer: BC Managed Care – PPO | Attending: Internal Medicine | Admitting: Internal Medicine

## 2022-04-19 ENCOUNTER — Encounter (HOSPITAL_COMMUNITY)
Admission: RE | Disposition: A | Discharge status: Home / Self Care | Payer: Self-pay | Source: Home / Self Care | Attending: Internal Medicine

## 2022-04-19 DIAGNOSIS — K449 Diaphragmatic hernia without obstruction or gangrene: Secondary | ICD-10-CM | POA: Insufficient documentation

## 2022-04-19 DIAGNOSIS — K2289 Other specified disease of esophagus: Secondary | ICD-10-CM | POA: Insufficient documentation

## 2022-04-19 DIAGNOSIS — R1319 Other dysphagia: Secondary | ICD-10-CM

## 2022-04-19 DIAGNOSIS — R1310 Dysphagia, unspecified: Secondary | ICD-10-CM | POA: Insufficient documentation

## 2022-04-19 DIAGNOSIS — Z87891 Personal history of nicotine dependence: Secondary | ICD-10-CM | POA: Diagnosis not present

## 2022-04-19 DIAGNOSIS — K2970 Gastritis, unspecified, without bleeding: Secondary | ICD-10-CM | POA: Insufficient documentation

## 2022-04-19 HISTORY — PX: ESOPHAGOGASTRODUODENOSCOPY (EGD) WITH PROPOFOL: SHX5813

## 2022-04-19 HISTORY — PX: BIOPSY: SHX5522

## 2022-04-19 HISTORY — PX: ESOPHAGEAL DILATION: SHX303

## 2022-04-19 SURGERY — ESOPHAGOGASTRODUODENOSCOPY (EGD) WITH PROPOFOL
Anesthesia: Monitor Anesthesia Care

## 2022-04-19 MED ORDER — SODIUM CHLORIDE 0.9 % IV SOLN: INTRAVENOUS | Status: DC

## 2022-04-19 MED ORDER — PROPOFOL 10 MG/ML IV BOLUS
INTRAVENOUS | Status: DC | PRN
  Administered 2022-04-19: 50 mg via INTRAVENOUS
  Administered 2022-04-19: 20 mg via INTRAVENOUS
  Administered 2022-04-19 (×3): 50 mg via INTRAVENOUS
  Administered 2022-04-19: 30 mg via INTRAVENOUS
  Administered 2022-04-19 (×3): 50 mg via INTRAVENOUS

## 2022-04-19 MED ORDER — LACTATED RINGERS IV SOLN
INTRAVENOUS | Status: DC
  Administered 2022-04-19: 1000 mL via INTRAVENOUS

## 2022-04-19 MED ORDER — LIDOCAINE HCL (CARDIAC) PF 100 MG/5ML IV SOSY
PREFILLED_SYRINGE | INTRAVENOUS | Status: DC | PRN
  Administered 2022-04-19: 100 mg via INTRAVENOUS

## 2022-04-19 SURGICAL SUPPLY — 15 items

## 2022-04-19 NOTE — H&P (Signed)
[facility] History and Physical   Primary Care Physician:  Patient, No Pcp Per   Reason for Procedure:   dysphagia  Plan:    EGD     HPI: Christina Robles is a 56 y.o. female w/ chronic intermittent dysphagia. Seen by Dr. Candis Schatz 10/2021 regarding this. We think there is a remote hx of esophageal manometry and patient says she has a hiatal hernia. Describes intermittent solid dysphagia several x a week. Minimal if any heartburn.   Past Medical History:  Diagnosis Date   Asthma    Blood clot in abdominal vein    Hiatal hernia    Uterine polyp     Past Surgical History:  Procedure Laterality Date   Blood clot     After a pregnancy 26 years ago   TUBAL LIGATION      Prior to Admission medications   Medication Sig Start Date End Date Taking? Authorizing Provider  acetaminophen (TYLENOL) 500 MG tablet Take 500 mg by mouth every 6 (six) hours as needed (for pain.).   Yes [provider]  albuterol (PROVENTIL HFA;VENTOLIN HFA) 108 (90 Base) MCG/ACT inhaler Inhale 1-2 puffs into the lungs every 6 (six) hours as needed for wheezing or shortness of breath. 01/16/17  Yes Harris, Abigail, PA-C  cetirizine (ZYRTEC) 10 MG tablet Take 10 mg by mouth in the morning.   Yes [provider]  fluticasone (FLONASE) 50 MCG/ACT nasal spray Place 2 sprays into both nostrils daily. 11/02/17  Yes Yu, Amy V, PA-C  Ibuprofen 200 MG CAPS Take 800 mg by mouth every 8 (eight) hours as needed (pain.).   Yes [provider]  MAGNESIUM PO Take 1 tablet by mouth every evening.   Yes [provider]  meclizine (ANTIVERT) 25 MG tablet Take 25 mg by mouth 2 (two) times daily as needed for dizziness.   Yes [provider]  montelukast (SINGULAIR) 10 MG tablet Take 10 mg by mouth in the morning.   Yes [provider]  Multiple Vitamin (MULTIVITAMIN WITH MINERALS) TABS tablet Take 1 tablet by mouth in the morning.   Yes [provider]   NONFORMULARY OR COMPOUNDED ITEM Place 600 mg under the tongue in the morning. Peptide Troches [id number]   Yes [provider]  Omega-3 Fatty Acids (OMEGA 3 PO) Take 2 capsules by mouth in the morning and at bedtime. OmegaXL Joint Pain Relief & Inflammation Supplement   Yes [provider]  SYMBICORT 160-4.5 MCG/ACT inhaler Inhale 2 puffs into the lungs 2 (two) times daily. 02/03/22  Yes [provider]    Current Facility-Administered Medications  Medication Dose Route Frequency Provider Last Rate Last Admin   0.9 %  sodium chloride infusion   Intravenous Continuous Gatha Mayer, MD       lactated ringers infusion   Intravenous Continuous Gatha Mayer, MD        Allergies as of 04/14/2022 - Review Complete 10/25/2021  Allergen Reaction Noted   Methocarbamol Anaphylaxis 05/26/2014   Cyclobenzaprine Nausea And Vomiting 11/22/2020   Meloxicam Nausea And Vomiting 05/26/2014   Penicillins Nausea And Vomiting and Other (See Comments) 09/26/2011    Family History  Problem Relation Age of Onset   Hypertension Father    Diabetes Cousin    Colon cancer Neg Hx    Esophageal cancer Neg Hx    Pancreatic cancer Neg Hx    Stomach cancer Neg Hx     Social History   Socioeconomic History  Marital status: Single    Spouse name: Not on file   Number of children: 4   Years of education: Not on file   Highest education level: Not on file  Occupational History   Occupation: EKG tech    Employer: [employer]    Comment: [employer].  Tobacco Use   Smoking status: Former    Packs/day: 0.30    Years: 20.00    Total pack years: 6.00    Types: Cigarettes    Quit date: 02/24/2011    Years since quitting: 11.1   Smokeless tobacco: Never  Vaping Use   Vaping Use: Never used  Substance and Sexual Activity   Alcohol use: Yes    Comment: occasionally   Drug use: No   Sexual activity: Not on file  Other Topics Concern   Not on file  Social  History Narrative   Not on file   Social Determinants of Health   Financial Resource Strain: Not on file  Food Insecurity: Not on file  Transportation Needs: Not on file  Physical Activity: Not on file  Stress: Not on file  Social Connections: Not on file  Intimate Partner Violence: Not on file    Review of Systems:  All other review of systems negative except as mentioned in the HPI.  Physical Exam: Vital signs Pulse 68   Temp 98.6 F (37 C) (Tympanic)   Resp 13   Ht '5\' 2"'$  (1.575 m)   Wt 126.8 kg   SpO2 100%   BMI 51.13 kg/m   General:   Alert,  Well-developed, well-nourished, pleasant and cooperative in NAD Lungs:  Clear throughout to auscultation.   Heart:  Regular rate and rhythm; no murmurs, clicks, rubs,  or gallops. Abdomen:  Soft, nontender and nondistended. Normal bowel sounds.   Neuro/Psych:  Alert and cooperative. Normal mood and affect. A and O x 3   '@Christina Robles'$  Simonne Maffucci, MD, [facility] [phone] (pager) 04/19/2022 8:55 AM@

## 2022-04-19 NOTE — Anesthesia Preprocedure Evaluation (Addendum)
Anesthesia Evaluation  Patient identified by MRN, date of birth, ID band Patient awake    Reviewed: Allergy & Precautions, NPO status , Patient's Chart, lab work & pertinent test results  Airway Mallampati: II  TM Distance: >3 FB Neck ROM: Full    Dental  (+) Dental Advisory Given, Missing   Pulmonary asthma , former smoker,    Pulmonary exam normal breath sounds clear to auscultation       Cardiovascular hypertension, Normal cardiovascular exam Rhythm:Regular Rate:Normal     Neuro/Psych negative neurological ROS     GI/Hepatic Neg liver ROS, hiatal hernia,   Endo/Other  negative endocrine ROS  Renal/GU negative Renal ROS     Musculoskeletal negative musculoskeletal ROS (+)   Abdominal   Peds  Hematology negative hematology ROS (+)   Anesthesia Other Findings Day of surgery medications reviewed with the patient.  Reproductive/Obstetrics                            Anesthesia Physical Anesthesia Plan  ASA: 2  Anesthesia Plan: MAC   Post-op Pain Management: Minimal or no pain anticipated   Induction: Intravenous  PONV Risk Score and Plan: 2 and TIVA and Treatment may vary due to age or medical condition  Airway Management Planned: Nasal Cannula and Natural Airway  Additional Equipment:   Intra-op Plan:   Post-operative Plan:   Informed Consent: I have reviewed the patients History and Physical, chart, labs and discussed the procedure including the risks, benefits and alternatives for the proposed anesthesia with the patient or authorized representative who has indicated his/her understanding and acceptance.     Dental advisory given  Plan Discussed with: CRNA  Anesthesia Plan Comments:         Anesthesia Quick Evaluation

## 2022-04-19 NOTE — Discharge Instructions (Signed)
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 I took biopsies to see if that is the case and also dilated the esophagus to see if that helps you swallow better.  Stomach looked irritated some also and I took biopsies of that.  In eosinophilic esophagitis ([id number]-o-FILL-ik uh-[id number]-uh-[id number]-tis), a type of white blood cell (eosinophil) builds up in the lining of the tube that connects your mouth to your stomach (esophagus). This buildup, which is a reaction to foods, allergens or acid reflux, can inflame or injure the esophageal tissue. Damaged esophageal tissue can lead to difficulty swallowing or cause food to get caught when you swallow.  Eosinophilic esophagitis is a chronic immune system disease. It has been identified only in the past two decades, but is now considered a major cause of digestive system (gastrointestinal) illness. Research is ongoing and will likely lead to revisions in its diagnosis and treatment. We do see it more frequently in people with allergic asthma.  YOU HAD AN ENDOSCOPIC PROCEDURE TODAY: Refer to the procedure report and other information in the discharge instructions given to you for any specific questions about what was found during the examination. If this information does not answer your questions, please call Dr. Celesta Aver office at [phone] to clarify.   YOU SHOULD EXPECT: Some feelings of bloating in the abdomen. Passage of more gas than usual. Walking can help get rid of the air that was put into your GI tract during the procedure and reduce the bloating. If you had a lower endoscopy (such as a colonoscopy or flexible sigmoidoscopy) you may notice spotting of blood in your stool or on the toilet paper. Some abdominal soreness may be present for a day or two, also.  DIET:   Clear liquids only until 1030 then soft foods today and normal consistency of foods starting tomorrow. No alcohol today.   ACTIVITY: Your care partner  should take you home directly after the procedure. You should plan to take it easy, moving slowly for the rest of the day. You can resume normal activity the day after the procedure however YOU SHOULD NOT DRIVE, use power tools, machinery or perform tasks that involve climbing or major physical exertion for 24 hours (because of the sedation medicines used during the test).   SYMPTOMS TO REPORT IMMEDIATELY: A gastroenterologist can be reached at any hour. Please call [phone]  for any of the following symptoms:   Following upper endoscopy (EGD, EUS, ERCP, esophageal dilation) Vomiting of blood or coffee ground material  New, significant abdominal pain  New, significant chest pain or pain under the shoulder blades  Painful or persistently difficult swallowing  New shortness of breath  Black, tarry-looking or red, bloody stools  FOLLOW UP:  If any biopsies were taken you will be contacted by phone or by letter within the next 1-3 weeks. Call [phone]  if you have not heard about the biopsies in 3 weeks.  Please also call with any specific questions about appointments or follow up tests.

## 2022-04-19 NOTE — Anesthesia Postprocedure Evaluation (Signed)
Anesthesia Post Note  Patient: Christina Robles  Procedure(s) Performed: ESOPHAGOGASTRODUODENOSCOPY (EGD) WITH PROPOFOL BIOPSY ESOPHAGEAL DILATION     Patient location during evaluation: Endoscopy Anesthesia Type: MAC Level of consciousness: oriented, awake and alert and awake Pain management: pain level controlled Vital Signs Assessment: post-procedure vital signs reviewed and stable Respiratory status: spontaneous breathing, nonlabored ventilation, respiratory function stable and patient connected to nasal cannula oxygen Cardiovascular status: blood pressure returned to baseline and stable Postop Assessment: no headache, no backache and no apparent nausea or vomiting Anesthetic complications: no   No notable events documented.  Last Vitals:  Vitals:   04/19/22 0937 04/19/22 0947  BP: (!) 168/85 (!) 155/89  Pulse: 76 71  Resp: 15 20  Temp:    SpO2: 97% 99%    Last Pain:  Vitals:   04/19/22 0947  TempSrc:   PainSc: 0-No pain                 Santa Lighter

## 2022-04-19 NOTE — Transfer of Care (Signed)
Immediate Anesthesia Transfer of Care Note  Patient: Christina Robles  Procedure(s) Performed: ESOPHAGOGASTRODUODENOSCOPY (EGD) WITH PROPOFOL BIOPSY ESOPHAGEAL DILATION  Patient Location: Endoscopy Unit  Anesthesia Type:MAC  Level of Consciousness: awake, alert , oriented, drowsy and patient cooperative  Airway & Oxygen Therapy: Patient Spontanous Breathing and Patient connected to nasal cannula oxygen  Post-op Assessment: Report given to RN and Post -op Vital signs reviewed and stable  Post vital signs: Reviewed and stable  Last Vitals:  Vitals Value Taken Time  BP 168/85 04/19/22 0930  Temp 36.2 C 04/19/22 0926  Pulse 76 04/19/22 0932  Resp 23 04/19/22 0932  SpO2 99 % 04/19/22 0932  Vitals shown include unvalidated device data.  Last Pain:  Vitals:   04/19/22 0926  TempSrc: Temporal  PainSc:          Complications: No notable events documented.

## 2022-04-19 NOTE — Op Note (Signed)
[facility] Patient Name: Christina Robles Procedure Date: 04/19/2022 MRN: [id number] Attending MD: Gatha Mayer , MD Date of Birth: 02/19/1966 CSN: [id number] Age: 56 Admit Type: Outpatient Procedure:                Upper GI endoscopy Indications:              Dysphagia Providers:                Gatha Mayer, MD, Burtis Junes, RN, Gloris Ham, Technician Referring MD:              Medicines:                Monitored Anesthesia Care Complications:            No immediate complications. Estimated Blood Loss:     Estimated blood loss was minimal. Procedure:                Pre-Anesthesia Assessment:                           - Prior to the procedure, a History and Physical                            was performed, and patient medications and                            allergies were reviewed. The patient's tolerance of                            previous anesthesia was also reviewed. The risks                            and benefits of the procedure and the sedation                            options and risks were discussed with the patient.                            All questions were answered, and informed consent                            was obtained. Prior Anticoagulants: The patient has                            taken no previous anticoagulant or antiplatelet                            agents. ASA Grade Assessment: II - A patient with                            mild systemic disease. After reviewing the risks  and benefits, the patient was deemed in                            satisfactory condition to undergo the procedure.                           After obtaining informed consent, the endoscope was                            passed under direct vision. Throughout the                            procedure, the patient's blood pressure, pulse, and                            oxygen saturations were  monitored continuously. The                            GIF-H190 ([id number]) Olympus endoscope was introduced                            through the mouth, and advanced to the second part                            of duodenum. The upper GI endoscopy was                            accomplished without difficulty. The patient                            tolerated the procedure fairly well. Scope In: Scope Out: Findings:      Mucosal changes including longitudinal furrows and punctate white spots       were found in the middle third of the esophagus and in the lower third       of the esophagus. Biopsies were obtained from the proximal and distal       esophagus with cold forceps for histology of suspected eosinophilic       esophagitis. Verification of patient identification for the specimen was       done. Estimated blood loss was minimal. The scope was withdrawn.       Dilation was performed with a Maloney dilator with mild resistance at 84       Fr. The dilation site was examined following endoscope reinsertion and       showed no change. Estimated blood loss: none.      Patchy moderate inflammation characterized by congestion (edema),       erythema and granularity was found in the prepyloric region of the       stomach. Biopsies were taken with a cold forceps for histology.       Verification of patient identification for the specimen was done.       Estimated blood loss was minimal.      The cardia and gastric fundus were otherwise normal on retroflexion.      The exam was otherwise without abnormality. Impression:               -  Esophageal mucosal changes suggestive of                            eosinophilic esophagitis. Biopsied. Dilated. 65 Fr                            - no evidence of dilation effect though changes                            from prior biopsies made it difficult to be certain.                           - Gastritis. Biopsied.                           - The  examination was otherwise normal. Moderate Sedation:      Not Applicable - Patient had care per Anesthesia. Recommendation:           - Patient has a contact number available for                            emergencies. The signs and symptoms of potential                            delayed complications were discussed with the                            patient. Return to normal activities tomorrow.                            Written discharge instructions were provided to the                            patient.                           - Clear liquids x 1 hour then soft foods rest of                            day. Start prior diet tomorrow.                           - Continue present medications.                           - Await pathology results. Procedure Code(s):        --- Professional ---                           [phone], Esophagogastroduodenoscopy, flexible,                            transoral; with biopsy, single or multiple                           43450, Dilation of esophagus,  by unguided sound or                            bougie, single or multiple passes Diagnosis Code(s):        --- Professional ---                           K22.8, Other specified diseases of esophagus                           K29.70, Gastritis, unspecified, without bleeding                           R13.10, Dysphagia, unspecified CPT copyright 2019 [employer]. All rights reserved. The codes documented in this report are preliminary and upon coder review may  be revised to meet current compliance requirements. Gatha Mayer, MD 04/19/2022 9:35:01 AM This report has been signed electronically. Number of Addenda: 0

## 2022-04-20 ENCOUNTER — Encounter (HOSPITAL_COMMUNITY): Payer: Self-pay | Admitting: Internal Medicine

## 2022-04-20 ENCOUNTER — Encounter: Payer: Self-pay | Admitting: Internal Medicine

## 2022-04-20 DIAGNOSIS — K200 Eosinophilic esophagitis: Secondary | ICD-10-CM

## 2022-04-20 HISTORY — DX: Eosinophilic esophagitis: K20.0

## 2022-04-20 LAB — SURGICAL PATHOLOGY

## 2022-04-21 ENCOUNTER — Other Ambulatory Visit: Payer: Self-pay

## 2022-04-21 DIAGNOSIS — K200 Eosinophilic esophagitis: Secondary | ICD-10-CM

## 2022-04-21 MED ORDER — OMEPRAZOLE 40 MG PO CPDR: 40.0000 mg | DELAYED_RELEASE_CAPSULE | Freq: Every day | ORAL | 3 refills | Status: DC

## 2022-05-25 ENCOUNTER — Encounter: Payer: Self-pay | Admitting: Gastroenterology

## 2022-05-25 ENCOUNTER — Other Ambulatory Visit: Payer: Self-pay | Admitting: Gastroenterology

## 2022-05-25 ENCOUNTER — Other Ambulatory Visit: Payer: Self-pay

## 2022-05-25 ENCOUNTER — Ambulatory Visit: Payer: BC Managed Care – PPO | Admitting: Gastroenterology

## 2022-05-25 VITALS — BP 138/92 | HR 76 | Ht 62.0 in | Wt 282.0 lb

## 2022-05-25 DIAGNOSIS — K200 Eosinophilic esophagitis: Secondary | ICD-10-CM | POA: Diagnosis not present

## 2022-05-25 MED ORDER — FLUTICASONE FUROATE 100 MCG/ACT IN AEPB: INHALATION_SPRAY | RESPIRATORY_TRACT | 3 refills | Status: DC

## 2022-05-25 MED ORDER — FLUTICASONE PROPIONATE (INHAL) 250 MCG/ACT IN AEPB: INHALATION_SPRAY | RESPIRATORY_TRACT | 3 refills | Status: DC

## 2022-05-25 NOTE — Progress Notes (Signed)
HPI : Christina Robles is a very pleasant 56 year old female with a history of morbid obesity, asthma and allergic rhinitis who I initially saw in January 2023 for colon cancer screening and she was also noted to have symptoms of dysphagia.  She underwent an upper endoscopy with Dr. Carlean Purl in July and had endoscopic features of eosinophilic esophagitis.  Esophageal biopsies confirmed elevated eosinophils (greater than 20 per high-power field in proximal and distal).  Empiric dilation with a 17 French savory was performed without obvious mucosal rent.  She was started on omeprazole and budesonide. Today, she reports significant improvement in her dysphagia.  She unfortunately has gained 12 pounds, which she says is due to the improvement in her swallowing.  She denies any GERD symptoms.  She is tolerating the medications well, but wonders if there are alternatives to the budesonide slurry, which is labor-intensive and expensive. She is also concerned about potential side effects of long-term PPI use. She states that she was told that she was being referred to allergy by her primary doctor, but has not heard anything about an appointment.  She states she last saw an allergist over 13 years ago.  She has been having some symptoms of swelling in her mouth, related to certain foods.     Past Medical History:  Diagnosis Date   Asthma    Blood clot in abdominal vein    Eosinophilic esophagitis 9/70/2637   Hiatal hernia    Uterine polyp      Past Surgical History:  Procedure Laterality Date   BIOPSY  04/19/2022   Procedure: BIOPSY;  Surgeon: Gatha Mayer, MD;  Location: [facility];  Service: Robles;;   Blood clot     After a pregnancy 26 years ago   ESOPHAGEAL DILATION  04/19/2022   Procedure: ESOPHAGEAL DILATION;  Surgeon: Gatha Mayer, MD;  Location: [facility];  Service: Robles;;   ESOPHAGOGASTRODUODENOSCOPY (EGD) WITH PROPOFOL N/A 04/19/2022   Procedure:  ESOPHAGOGASTRODUODENOSCOPY (EGD) WITH PROPOFOL;  Surgeon: Gatha Mayer, MD;  Location: [facility];  Service: Robles;  Laterality: N/A;   TUBAL LIGATION     Family History  Problem Relation Age of Onset   Hypertension Father    Diabetes Cousin    Colon cancer Neg Hx    Esophageal cancer Neg Hx    Pancreatic cancer Neg Hx    Stomach cancer Neg Hx    Social History   Tobacco Use   Smoking status: Former    Packs/day: 0.30    Years: 20.00    Total pack years: 6.00    Types: Cigarettes    Quit date: 02/24/2011    Years since quitting: 11.2   Smokeless tobacco: Never  Vaping Use   Vaping Use: Never used  Substance Use Topics   Alcohol use: Yes    Comment: occasionally   Drug use: No   Current Outpatient Medications  Medication Sig Dispense Refill   acetaminophen (TYLENOL) 500 MG tablet Take 500 mg by mouth every 6 (six) hours as needed (for pain.).     albuterol (PROVENTIL HFA;VENTOLIN HFA) 108 (90 Base) MCG/ACT inhaler Inhale 1-2 puffs into the lungs every 6 (six) hours as needed for wheezing or shortness of breath. 1 Inhaler 0   cetirizine (ZYRTEC) 10 MG tablet Take 10 mg by mouth in the morning.     fluticasone (FLONASE) 50 MCG/ACT nasal spray Place 2 sprays into both nostrils daily. 1 g 0   Ibuprofen 200 MG CAPS Take 800 mg  by mouth every 8 (eight) hours as needed (pain.).     MAGNESIUM PO Take 1 tablet by mouth every evening.     meclizine (ANTIVERT) 25 MG tablet Take 25 mg by mouth 2 (two) times daily as needed for dizziness.     montelukast (SINGULAIR) 10 MG tablet Take 10 mg by mouth in the morning.     Multiple Vitamin (MULTIVITAMIN WITH MINERALS) TABS tablet Take 1 tablet by mouth in the morning.     NONFORMULARY OR COMPOUNDED ITEM Place 600 mg under the tongue in the morning. Peptide Troches [id number]     Omega-3 Fatty Acids (OMEGA 3 PO) Take 2 capsules by mouth in the morning and at bedtime. OmegaXL Joint Pain Relief & Inflammation Supplement      omeprazole (PRILOSEC) 40 MG capsule Take 1 capsule (40 mg total) by mouth daily. 90 capsule 3   SYMBICORT 160-4.5 MCG/ACT inhaler Inhale 2 puffs into the lungs 2 (two) times daily.     No current facility-administered medications for this visit.   Allergies  Allergen Reactions   Methocarbamol Anaphylaxis    "Closed my throat up."  "Closed my throat up."    Cyclobenzaprine Nausea And Vomiting   Meloxicam Nausea And Vomiting    "Closed my throat up."    Penicillins Nausea And Vomiting and Other (See Comments)    Has patient had a PCN reaction causing immediate rash, facial/tongue/throat swelling, SOB or lightheadedness with hypotension: NO Has patient had a PCN reaction causing severe rash involving mucus membranes or skin necrosis: NO Has patient had a PCN reaction that required hospitalization NO Has patient had a PCN reaction occurring within the last 10 years: NO If all of the above answers are "NO", then may proceed with Cephalosporin use.  Has patient had a PCN reaction causing immediate rash, facial/tongue/throat swelling, SOB or lightheadedness with hypotension: NO Has patient had a PCN reaction causing severe rash involving mucus membranes or skin necrosis: NO Has patient had a PCN reaction that required hospitalization NO Has patient had a PCN reaction occurring within the last 10 years: NO If all of the above answers are "NO", then may proceed with Cephalosporin use.      Review of Systems: All systems reviewed and negative except where noted in HPI.    No results found.  Physical Exam: Ht '5\' 2"'$  (1.575 m)   Wt 282 lb (127.9 kg)   BMI 51.58 kg/m  Constitutional: Pleasant,well-developed, African-American female in no acute distress. Neurological: Alert and oriented to person place and time. Skin: Skin is warm and dry. No rashes noted. Psychiatric: Normal mood and affect. Behavior is normal.  CBC    Component Value Date/Time   WBC 9.7 01/16/2017 1818   RBC 4.86  01/16/2017 1818   HGB 13.3 01/16/2017 1818   HCT 38.0 01/16/2017 1818   PLT 248 01/16/2017 1818   MCV 78.2 01/16/2017 1818   MCH 27.4 01/16/2017 1818   MCHC 35.0 01/16/2017 1818   RDW 15.0 01/16/2017 1818   LYMPHSABS 3.0 11/01/2016 0400   MONOABS 0.6 11/01/2016 0400   EOSABS 0.4 11/01/2016 0400   BASOSABS 0.0 11/01/2016 0400    CMP     Component Value Date/Time   NA 139 01/16/2017 1818   K 3.4 (L) 01/16/2017 1818   CL 105 01/16/2017 1818   CO2 25 01/16/2017 1818   GLUCOSE 95 01/16/2017 1818   BUN 18 01/16/2017 1818   CREATININE 0.85 01/16/2017 1818   CALCIUM 9.1 01/16/2017 1818  PROT 7.5 08/17/2014 0518   ALBUMIN 3.6 08/17/2014 0518   AST 24 08/17/2014 0518   ALT 33 08/17/2014 0518   ALKPHOS 112 08/17/2014 0518   BILITOT <0.2 (L) 08/17/2014 0518   GFRNONAA >60 01/16/2017 1818   GFRAA >60 01/16/2017 1818     ASSESSMENT AND PLAN: 56 year old female with recently diagnosed eosinophilic esophagitis, initiated on budesonide and PPI therapy.  The patient request alternatives to budesonide.  We discussed fluticasone inhaler, which she seemed much more interested in.  She already takes Symbicort for her asthma.  We discussed the pathophysiology and natural history of eosinophilic esophagitis.  We discussed the potential for progression of her dysphagia and potential for narrow caliber esophagus if her disease is not adequately treated.  We discussed the principles of treatment of EOE which include PPI use, topical steroids and potential identification of dietary triggers.  We briefly discussed the role of empiric diets, but did not go into detail about that today.  I recommended that she be evaluated by an allergist to potentially identify dietary triggers. In the meantime, I recommend she continue taking the omeprazole daily.  We can consider decreasing this to 20 mg when she is due for her next refill.  We will switch her steroid from budesonide slurry to fluticasone.  She was  instructed to swallow, not inhale 2 puffs twice a day.  I would like to repeat an upper endoscopy at some point to assess for mucosal healing/histologic remission.  Given her BMI, this will again need to be done in hospital setting.  Currently do not have any availability.  She also still needs to undergo her initial average risk screening colonoscopy.  We can do the EGD at the same time when an appointment is available. We also discussed the potential adverse events with chronic PPI use to include decreased bone mineral density, vitamin deficiencies, increased risk of gastrointestinal infections, hypomagnesemia and renal dysfunction.  For her, I would be most concerned about decreased bone mineral density given that she is perimenopausal.  I recommend she start taking a daily vitamin D supplement.  We will plan for bone density scan within 2 years.   EoE -Switch from budesonide to fluticasone (swallowed 2 puffs twice daily) -Allergy referral -Cont omeprazole 40, decrease to 20 eventually -Repeat EGD at time of screening colonoscopy (hospital setting due to BMI), date TBD - Start daily vitamin D supplement - Check baseline renal function given chronic PPI use  Nathanie Ottley E. Candis Schatz, MD Christina Robles  CC:  Vonna Drafts, FNP

## 2022-05-25 NOTE — Patient Instructions (Addendum)
If you are age 54 or younger, your body mass index should be between 19-25. Your Body mass index is 51.58 kg/m. If this is out of the aformentioned range listed, please consider follow up with your Primary Care Provider.  ________________________________________________________  The [facility] providers would like to encourage you to use [facility] to communicate with providers for non-urgent requests or questions.  Due to long hold times on the telephone, sending your provider a message by [facility] may be a faster and more efficient way to get a response.  Please allow 48 business hours for a response.  Please remember that this is for non-urgent requests.  _______________________________________________________  START Fluticasone 250 mcg 2 puffs swallowed twice daily  We have sent a referral to Asthma and Allergy. Please allow them 2-3 weeks to contact you to schedule an appointment.  Thank you for entrusting me with your care and choosing [facility].  Dr Candis Schatz

## 2022-07-21 ENCOUNTER — Ambulatory Visit: Payer: BC Managed Care – PPO | Admitting: Allergy

## 2022-07-21 ENCOUNTER — Encounter: Payer: Self-pay | Admitting: Allergy

## 2022-07-21 VITALS — BP 144/84 | HR 93 | Temp 98.0°F | Resp 12 | Ht 62.5 in | Wt 282.8 lb

## 2022-07-21 DIAGNOSIS — J4530 Mild persistent asthma, uncomplicated: Secondary | ICD-10-CM | POA: Diagnosis not present

## 2022-07-21 DIAGNOSIS — J3089 Other allergic rhinitis: Secondary | ICD-10-CM | POA: Diagnosis not present

## 2022-07-21 DIAGNOSIS — H1013 Acute atopic conjunctivitis, bilateral: Secondary | ICD-10-CM | POA: Diagnosis not present

## 2022-07-21 DIAGNOSIS — K200 Eosinophilic esophagitis: Secondary | ICD-10-CM

## 2022-07-21 MED ORDER — ASMANEX HFA 200 MCG/ACT IN AERO: INHALATION_SPRAY | RESPIRATORY_TRACT | 5 refills | Status: AC

## 2022-07-21 NOTE — Progress Notes (Signed)
New Patient Note  RE: Christina Robles MRN: [id number] DOB: 1966-08-03 Date of Office Visit: 07/21/2022   Primary care provider: Vonna Drafts, FNP  Chief Complaint: EOE  History of present illness: Christina Robles is a 56 y.o. female presenting today for evaluation of EOE.    She has a diagnosis of EOE.  She was having swallowing difficulties with little vomiting daily basis and weight loss due to inability to keep the food down.  She was having most issues surrounding meats like steak.  She is following with Dr. Candis Schatz in gastroenterology.   She is having symptoms of dysphagia and had an upper endoscopy done in July that had endoscopic features of EOE; esophageal biopsies showed eosinophils greater than 20 per high-power field.  She was started on omeprazole and budesonide slurry.  She states her symptoms improved but the budesonide slurry was expensive and involved a lot of work.  Due to this she was changed to fluticasone inhaler.  The fluticasone she was given by the pharmacy was Arnuity she has a dry powder inhaler.  She states she has been doing 2 puffs swallowed twice a day of this dry powder inhaler. She does state since she started therapy and of course having the dilation that her swallowing symptoms are better.  She states she is not vomiting every day like she used to.  She states she even has gained weight as she has been able to eat and keep the food down.  Patient reports history of allergic rhinitis with year-round symptoms of itchy watery eyes, runny and stuffy nose and sneezing.  She has been taking Singulair, zyrtec and Flonase daily religiously.  The combination of this regimen helps with her allergy symptom control.  She stopped all of his medicines for this visit today and she has been miserable.  She currently states she has a pretty bad headache.  She cannot wait to get back on her allergy medications.  She did have allergy testing about 13 or so years ago and states  she is allergic to pretty much everything.  She states when she lived in [place] about 20 years ago her allergies were not nearly this bad.  She did undergo immunotherapy but states she was only on it for about 2 months as she was not able to progress and thus it was stopped.  She states she does have an EpiPen for her allergies as she states she had throat closure once a week when she was around grass.  She reports history of asthma.  She states it is pretty well controlled with her current regimen of Symbicort 160 mcg 2 puffs inhaled twice a day.  She states the only time she uses her albuterol is at the season changes.  She states she does her best to stay home if the air quality is poor. She will do her peak flow at home.   Review of systems: Review of Systems  Constitutional: Negative.   HENT:         See HPI  Eyes: Negative.   Respiratory: Negative.    Cardiovascular: Negative.   Gastrointestinal: Negative.   Musculoskeletal: Negative.   Skin: Negative.   Allergic/Immunologic: Negative.   Neurological:        See HPI    All other systems negative unless noted above in HPI  Past medical history: Past Medical History:  Diagnosis Date   Asthma    Blood clot in abdominal vein    Eczema  Eosinophilic esophagitis 94/49/6759   Hiatal hernia    Uterine polyp     Past surgical history: Past Surgical History:  Procedure Laterality Date   BIOPSY  04/19/2022   Procedure: BIOPSY;  Surgeon: Gatha Mayer, MD;  Location: [facility];  Service: Gastroenterology;;   Blood clot     After a pregnancy 26 years ago   ESOPHAGEAL DILATION  04/19/2022   Procedure: ESOPHAGEAL DILATION;  Surgeon: Gatha Mayer, MD;  Location: [facility];  Service: Gastroenterology;;   ESOPHAGOGASTRODUODENOSCOPY (EGD) WITH PROPOFOL N/A 04/19/2022   Procedure: ESOPHAGOGASTRODUODENOSCOPY (EGD) WITH PROPOFOL;  Surgeon: Gatha Mayer, MD;  Location: [facility];  Service: Gastroenterology;  Laterality: N/A;    TUBAL LIGATION      Family history:  Family History  Problem Relation Age of Onset   Asthma Mother    Hypertension Father    Asthma Sister    Asthma Brother    Diabetes Cousin    Colon cancer Neg Hx    Esophageal cancer Neg Hx    Pancreatic cancer Neg Hx    Stomach cancer Neg Hx     Social history: Lives in a home with carpeting in the bedroom with electric heating and central cooling.  No pets in the home.  There is no concern for water damage, mildew or roaches in the home.  She is a respiratory therapist.  Tobacco Use   Smoking status: Former    Packs/day: 0.30    Years: 20.00    Total pack years: 6.00    Types: Cigarettes    Quit date: 02/24/2011    Years since quitting: 11.4    Passive exposure: Past   Smokeless tobacco: Never  Vaping Use   Vaping Use: Never used     Medication List: Current Outpatient Medications  Medication Sig Dispense Refill   acetaminophen (TYLENOL) 500 MG tablet Take 500 mg by mouth every 6 (six) hours as needed (for pain.).     albuterol (PROVENTIL HFA;VENTOLIN HFA) 108 (90 Base) MCG/ACT inhaler Inhale 1-2 puffs into the lungs every 6 (six) hours as needed for wheezing or shortness of breath. 1 Inhaler 0   cetirizine (ZYRTEC) 10 MG tablet Take 10 mg by mouth in the morning.     Cholecalciferol (VITAMIN D3) 25 MCG (1000 UT) CAPS      fluticasone (FLONASE) 50 MCG/ACT nasal spray Place 2 sprays into both nostrils daily. 1 g 0   Fluticasone Furoate (ARNUITY ELLIPTA) 100 MCG/ACT AEPB      Fluticasone Furoate 100 MCG/ACT AEPB 2 puffs swallowed twice daily 30 each 3   MAGNESIUM PO Take 1 tablet by mouth every evening.     meclizine (ANTIVERT) 25 MG tablet Take 25 mg by mouth 2 (two) times daily as needed for dizziness.     montelukast (SINGULAIR) 10 MG tablet Take 10 mg by mouth in the morning.     Multiple Vitamin (MULTIVITAMIN WITH MINERALS) TABS tablet Take 1 tablet by mouth in the morning.     Omega-3 Fatty Acids (OMEGA 3 PO) Take 2 capsules  by mouth in the morning and at bedtime. OmegaXL Joint Pain Relief & Inflammation Supplement     omeprazole (PRILOSEC) 40 MG capsule Take 1 capsule (40 mg total) by mouth daily. 90 capsule 3   SYMBICORT 160-4.5 MCG/ACT inhaler Inhale 2 puffs into the lungs 2 (two) times daily.     Ibuprofen 200 MG CAPS Take 800 mg by mouth every 8 (eight) hours as needed (pain.).  NONFORMULARY OR COMPOUNDED ITEM Place 600 mg under the tongue in the morning. Peptide Troches [id number]     No current facility-administered medications for this visit.    Known medication allergies: Allergies  Allergen Reactions   Methocarbamol Anaphylaxis    "Closed my throat up."  "Closed my throat up."    Cyclobenzaprine Nausea And Vomiting   Meloxicam Nausea And Vomiting    "Closed my throat up."    Penicillins Nausea And Vomiting and Other (See Comments)    Has patient had a PCN reaction causing immediate rash, facial/tongue/throat swelling, SOB or lightheadedness with hypotension: NO Has patient had a PCN reaction causing severe rash involving mucus membranes or skin necrosis: NO Has patient had a PCN reaction that required hospitalization NO Has patient had a PCN reaction occurring within the last 10 years: NO If all of the above answers are "NO", then may proceed with Cephalosporin use.  Has patient had a PCN reaction causing immediate rash, facial/tongue/throat swelling, SOB or lightheadedness with hypotension: NO Has patient had a PCN reaction causing severe rash involving mucus membranes or skin necrosis: NO Has patient had a PCN reaction that required hospitalization NO Has patient had a PCN reaction occurring within the last 10 years: NO If all of the above answers are "NO", then may proceed with Cephalosporin use.      Physical examination: Blood pressure (!) 144/84, pulse 93, temperature 98 F (36.7 C), temperature source Temporal, resp. rate 12, height 5' 2.5" (1.588 m), weight 282 lb 12.8 oz (128.3  kg), SpO2 97 %.  General: Alert, interactive, mild distress with eyes closed during visit reported due to headache.  Appears in less distress dimming the lights HEENT: PERRLA, TMs pearly gray, turbinates moderately edematous without discharge, post-pharynx non erythematous. Neck: Supple without lymphadenopathy. Lungs: Clear to auscultation without wheezing, rhonchi or rales. {no increased work of breathing. CV: Normal S1, S2 without murmurs. Abdomen: Nondistended, nontender. Skin: Warm and dry, without lesions or rashes. Extremities:  No clubbing, cyanosis or edema. Neuro:   Grossly intact.  Diagnositics/Labs:  Spirometry: FEV1: 2.02L 99%, FVC: 2.3L 89%, ratio consistent with nonobstructive pattern  Allergy testing:   Airborne Adult Perc - 07/21/22 1458     Time Antigen Placed 1458    Allergen Manufacturer Lavella Hammock    Location Back    Number of Test 59    2. Control-Histamine 1 mg/ml 2+    4. Gem Negative    5. [place] Negative    6. Johnson Negative    7. [place] Blue 3+    8. Meadow Fescue Negative    9. Perennial Rye Negative    10. Sweet Vernal 3+    11. Timothy 3+    12. Cocklebur Negative    13. Burweed Marshelder Negative    14. Ragweed, short Negative    15. Ragweed, Giant Negative    16. Plantain,  English Negative    17. Lamb's Quarters Negative    18. Sheep Sorrell Negative    19. Rough Pigweed Negative    20. Marsh Elder, Rough Negative    21. Mugwort, Common Negative    22. Ash mix Negative    23. Birch mix Negative    24. Beech American Negative    25. Box, Elder 2+    26. Cedar, red Negative    27. Cottonwood, [place] Negative    28. Elm mix Negative    29. Hickory Negative    30. Maple mix Negative    31.  Oak, [place] mix Negative    32. Pecan Pollen 2+    33. Pine mix Negative    34. Sycamore Eastern Negative    35. [place], Black Pollen Negative    36. Alternaria alternata 4+    37. Cladosporium Herbarum Negative    38. Aspergillus mix  Negative    39. Penicillium mix Negative    40. Bipolaris sorokiniana (Helminthosporium) Negative    41. Drechslera spicifera (Curvularia) Negative    42. Mucor plumbeus Negative    43. Fusarium moniliforme Negative    44. Aureobasidium pullulans (pullulara) Negative    45. Rhizopus oryzae Negative    46. Botrytis cinera Negative    47. Epicoccum nigrum Negative    48. Phoma betae 2+    49. Candida Albicans Negative    50. Trichophyton mentagrophytes Negative    51. Mite, D Farinae  5,000 AU/ml Negative    52. Mite, D Pteronyssinus  5,000 AU/ml Negative    53. Cat Hair 10,000 BAU/ml Negative    54.  Dog Epithelia Negative    55. Mixed Feathers Negative    56. Horse Epithelia Negative    57. Cockroach, German Negative    58. Mouse Negative    59. Tobacco Leaf Negative             Food Adult Perc - 07/21/22 1400     Time Antigen Placed 1459    Allergen Manufacturer Lavella Hammock    Location Back    Number of allergen test 72    Control-Histamine 1 mg/ml 2+    1. Peanut Negative    2. Soybean Negative    3. Wheat Negative    4. Sesame Negative    5. Milk, cow Negative    6. Egg White, Chicken Negative    7. Casein Negative    8. Shellfish Mix Negative    9. Fish Mix Negative    10. Cashew Negative    11. Pecan Food Negative    12. [place] Negative    13. Almond Negative    14. Hazelnut Negative    15. [place] nut Negative    16. Coconut Negative    17. Pistachio Negative    18. Catfish Negative    19. Bass Negative    20. Trout Negative    21. Tuna Negative    22. Salmon Negative    23. Flounder Negative    24. Codfish Negative    25. Shrimp Negative    26. Crab Negative    27. Lobster Negative    28. Oyster Negative    29. Scallops Negative    30. Barley Negative    31. Oat  Negative    32. Rye  Negative    33. Hops Negative    34. Rice Negative    35. Cottonseed Negative    36. Saccharomyces Cerevisiae  Negative    37. Pork Negative    38. [place] Meat  Negative    39. Chicken Meat Negative    40. Beef Negative    41. Lamb Negative    42. Tomato Negative    43. White Potato Negative    44. Sweet Potato Negative    45. Pea, Green/English 2+    46. Navy Bean 2+    47. Mushrooms Negative    48. Avocado Negative    49. Onion Negative    50. Cabbage Negative    51. Carrots Negative    52. Celery Negative  53. Corn Negative    54. Cucumber Negative    55. Grape (White seedless) Negative    56. Orange  Negative    57. Banana Negative    58. Apple Negative    59. Peach Negative    60. Strawberry Negative    61. Cantaloupe Negative    62. Watermelon Negative    63. Pineapple Negative    64. Chocolate/Cacao bean Negative    65. Karaya Gum Negative    66. Acacia (Arabic Gum) Negative    67. Cinnamon Negative    68. Nutmeg Negative    69. Ginger Negative    70. Garlic Negative    71. Pepper, black Negative    72. Mustard Negative             Allergy testing results were read and interpreted by provider, documented by clinical staff.   Assessment and plan: EOE -  Kasidi has concomitant perennial and seasonal allergic rhinitis.  Environmental allergy testing is positive to grasses, trees, mold.  Allergen avoidance measures provided.  -  Food allergen skin prick tests were positive only to peas and beans.  If you are noticing symptoms after pea or bean ingestion then avoid in diet otherwise you can keep in diet if not having any symptoms.  The negative predictive value for food allergen skin testing is excellent, with the exception of milk. However, false negatives may occur, particularly with milk. Therefore if symptoms persist or progress a trial elimination of milk is a reasonable consideration.  -  Continue swallowed steroid twice a day but will change to the mist inhaler Flovent 277mg 2 sprays into mouth swallowed.  Do not eat or drink for 30 minutes after use. (stop the Arnuity dry powder)   **changing to Asmanex HFA due  to insurance preference -  Continue Prilosec per your gastroenterologist -  We have discussed Dupixent injections as at one therapy for EOE management.  Dupixent injections are done weekly and can be done at home if you are comfortable doing so.  Dupixent is also an asthma medication thus you likely will find even better asthma control if you were to start [place]  Benefits and risk of Dupixent discussed and informational brochure provided. -  Continue to follow-up with Dr. CCandis Schatzin GI for routine monitoring  Allergic rhinitis with conjunctivitis -  See above for test results - Continue with: Zyrtec (cetirizine) '10mg'$  tablet once daily, Singulair (montelukast) '10mg'$  daily, and Flonase (fluticasone) two sprays per nostril daily (AIM FOR EAR ON EACH SIDE) - You can use an extra dose of the antihistamine, if needed, for breakthrough symptoms.  - You have access to an epinephrine device for allergic reaction.  Follow emergency action plan in case of allergic reaction and when to use epinephrine device.  Asthma -  Lung function testing looks great today . - Daily controller medication(s): Symbicort 160/4.519m two puffs twice daily with spacer - Prior to physical activity: albuterol 2 puffs 10-15 minutes before physical activity. - Rescue medications: albuterol 2 puffs every 4-6 hours as needed  - Asthma control goals:  * Full participation in all desired activities (may need albuterol before activity) * Albuterol use two time or less a week on average (not counting use with activity) * Cough interfering with sleep two time or less a month * Oral steroids no more than once a year * No hospitalizations   Follow-up with me for routine visit in 4-6 months or sooner if needed  I  appreciate the opportunity to take part in Mollye's care. Please do not hesitate to contact me with questions.  Sincerely,   Prudy Feeler, MD Allergy/Immunology [facility]

## 2022-07-21 NOTE — Patient Instructions (Addendum)
EOE -  Christina Robles has concomitant perennial and seasonal allergic rhinitis.  Environmental allergy testing is positive to grasses, trees, mold.  Allergen avoidance measures provided.  -  Food allergen skin prick tests were positive only to peas and beans.  If you are noticing symptoms after pea or bean ingestion then avoid in diet otherwise you can keep in diet if not having any symptoms.  The negative predictive value for food allergen skin testing is excellent, with the exception of milk. However, false negatives may occur, particularly with milk. Therefore if symptoms persist or progress a trial elimination of milk is a reasonable consideration.  -  Continue swallowed steroid twice a day but will change to the mist inhaler Flovent 213mg 2 sprays into mouth swallowed.  Do not eat or drink for 30 minutes after use. (stop the Arnuity dry powder) -  Continue Prilosec per your gastroenterologist -  We have discussed Dupixent injections as at one therapy for EOE management.  Dupixent injections are done weekly and can be done at home if you are comfortable doing so.  Dupixent is also an asthma medication thus you likely will find even better asthma control if you were to start [place]  Benefits and risk of Dupixent discussed and informational brochure provided. -  Continue to follow-up with Dr. CCandis Robles GI for routine monitoring  Allergic rhinitis with conjunctivitis -  See above for test results - Continue with: Zyrtec (cetirizine) '10mg'$  tablet once daily, Singulair (montelukast) '10mg'$  daily, and Flonase (fluticasone) two sprays per nostril daily (AIM FOR EAR ON EACH SIDE) - You can use an extra dose of the antihistamine, if needed, for breakthrough symptoms.  - You have access to an epinephrine device for allergic reaction.  Follow emergency action plan in case of allergic reaction and when to use epinephrine device.  Asthma -  Lung function testing looks great today . - Daily controller  medication(s): Symbicort 160/4.512m two puffs twice daily with spacer - Prior to physical activity: albuterol 2 puffs 10-15 minutes before physical activity. - Rescue medications: albuterol 2 puffs every 4-6 hours as needed  - Asthma control goals:  * Full participation in all desired activities (may need albuterol before activity) * Albuterol use two time or less a week on average (not counting use with activity) * Cough interfering with sleep two time or less a month * Oral steroids no more than once a year * No hospitalizations   Follow-up with me for routine visit in 4-6 months or sooner if needed

## 2022-09-01 IMAGING — MG MM DIGITAL DIAGNOSTIC UNILAT*R* W/ TOMO W/ CAD
6 series · 6 of 18 positions shown · non-contrast
Comparison: Previous exams including recent screening mammogram
dated 10/11/2021.

CLINICAL DATA: Patient returns today to evaluate a possible RIGHT
breast asymmetry questioned on recent screening mammogram. History
of bilateral breast cysts.

EXAM:
DIGITAL DIAGNOSTIC UNILATERAL RIGHT MAMMOGRAM WITH TOMOSYNTHESIS AND
CAD
TECHNIQUE: Right digital diagnostic mammography and breast tomosynthesis was
performed. The images were evaluated with computer-aided detection.

[R ML synth-2D]
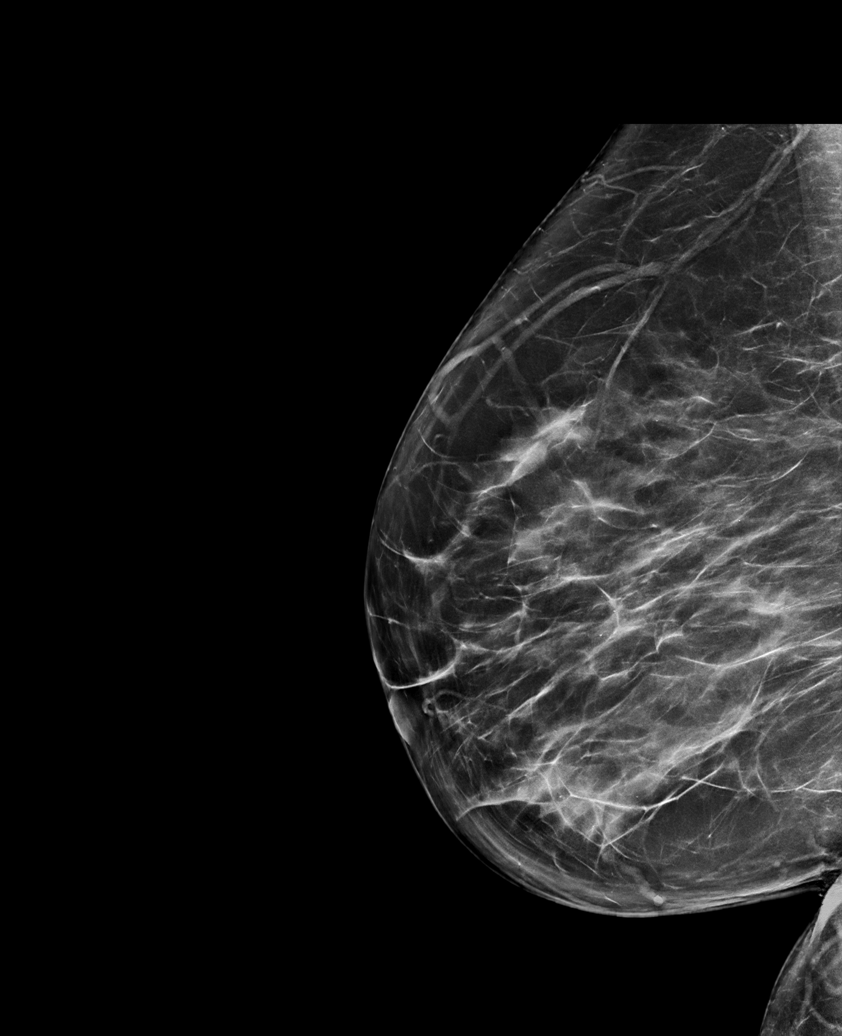

[R MLO synth-2D (1 of 2)]
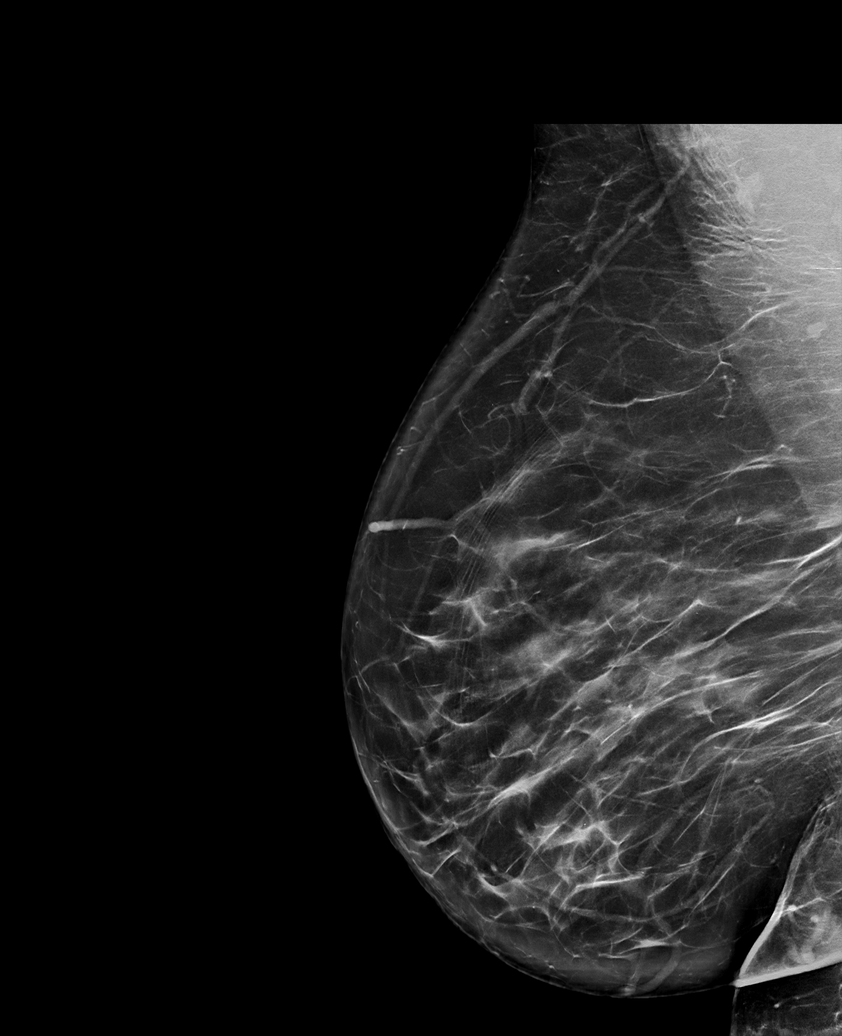

[R MLO synth-2D (2 of 2)]
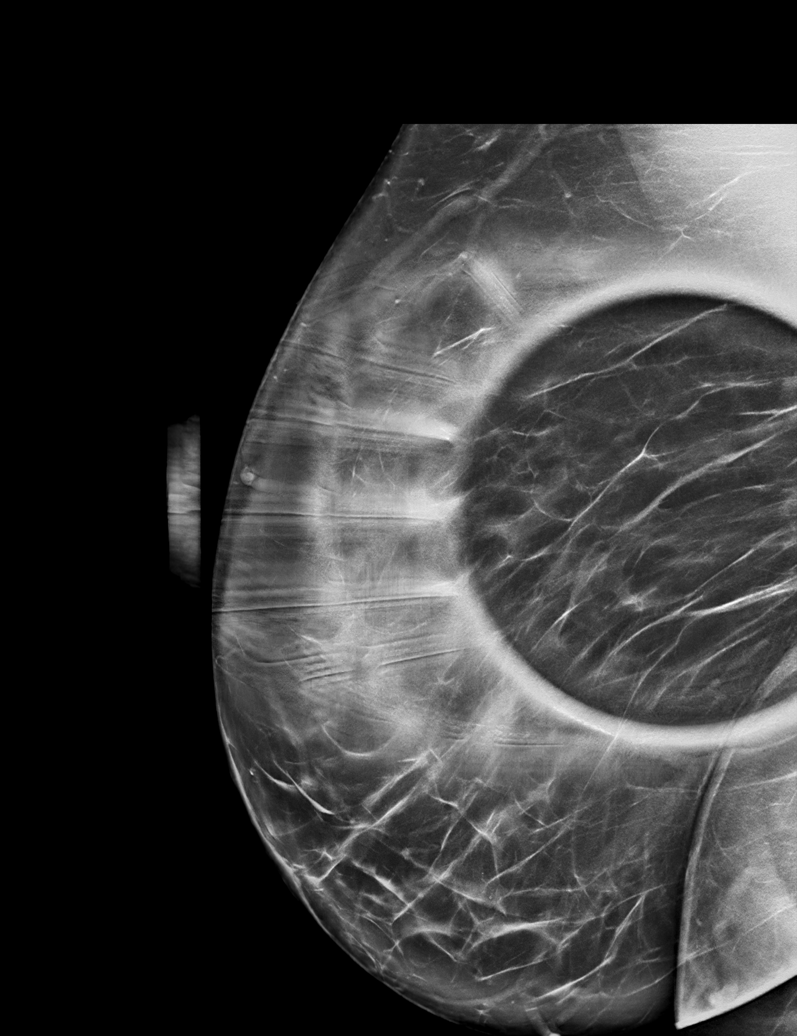

[R MLO tomo (1 of 2) · tomo slice 47/94.0]
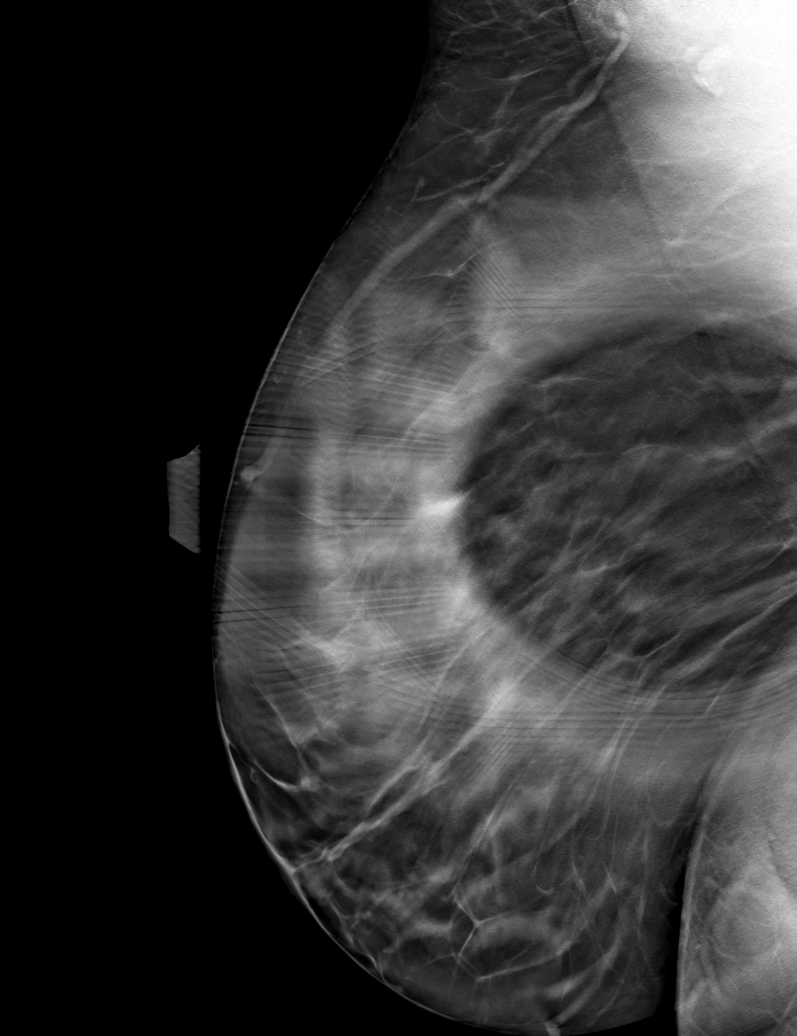

[R MLO tomo (2 of 2) · tomo slice 47/92.0]
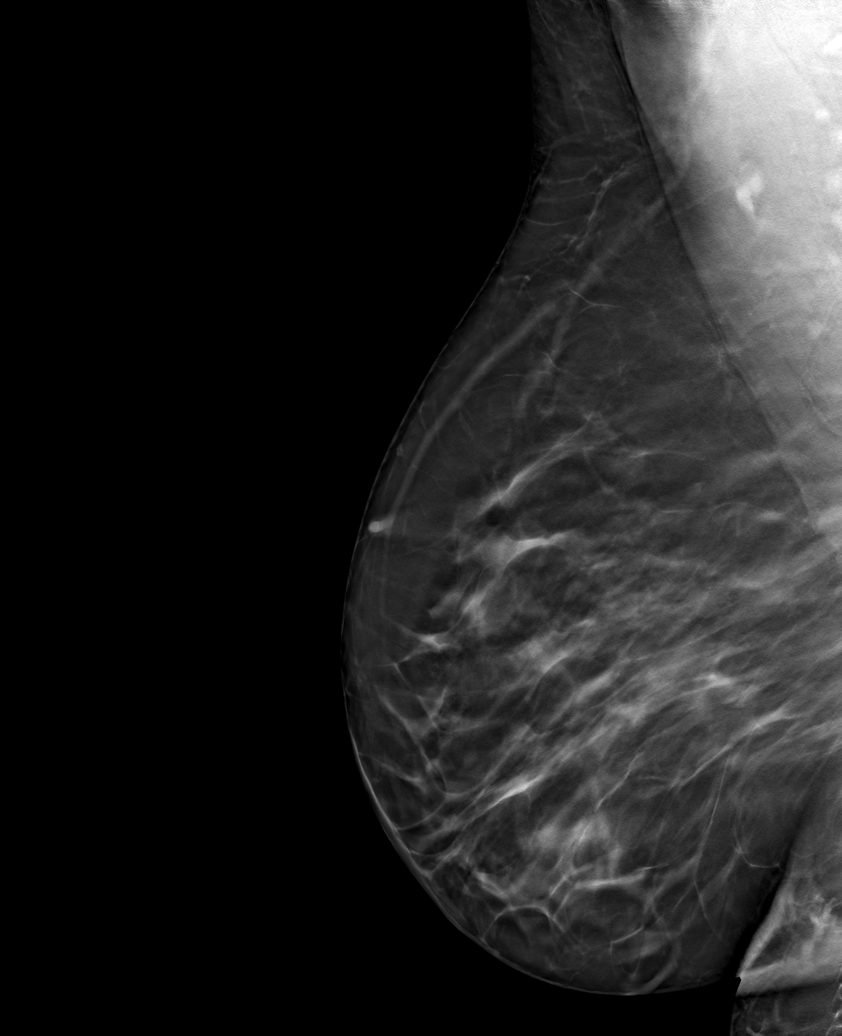

[R ML tomo · tomo slice 42/83.0]
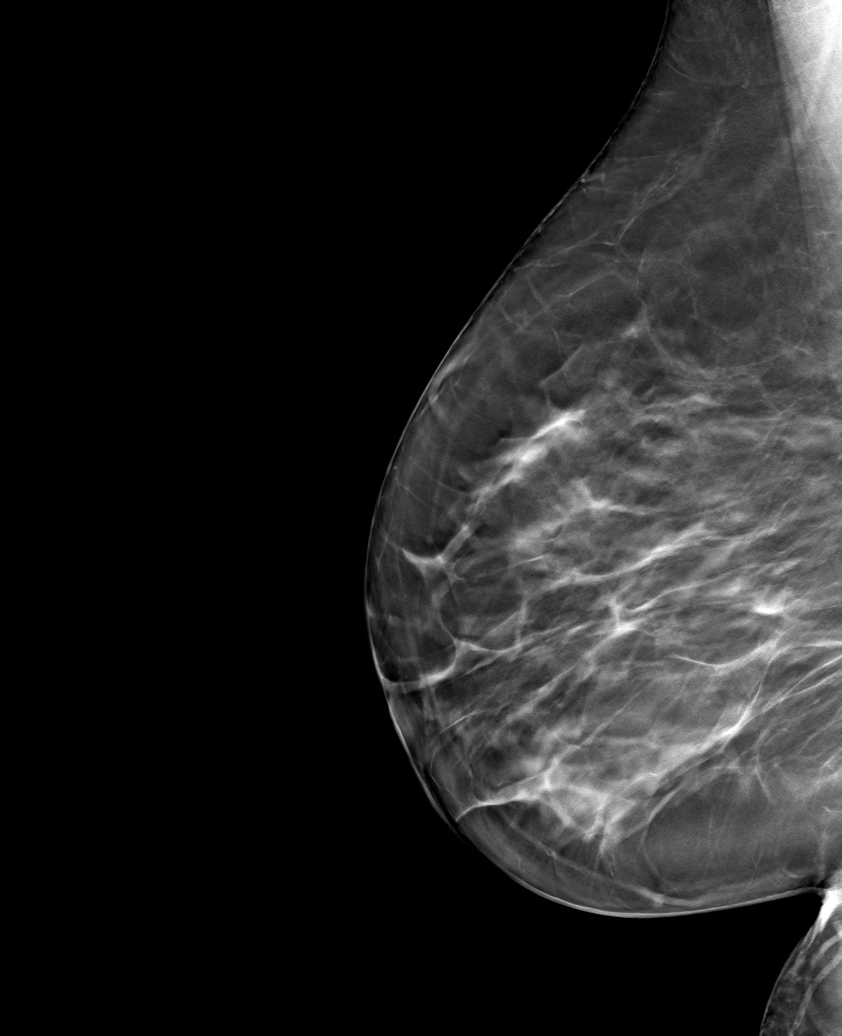

[6 of 18 positions shown; findings below may reference images not displayed]

ACR Breast Density Category c: The breast tissue is heterogeneously
dense, which may obscure small masses.
FINDINGS: On today's additional diagnostic views, including spot compression
with 3D tomosynthesis, there is no persistent asymmetry within the
posterior RIGHT breast indicating superimposition of normal
fibroglandular tissues.
IMPRESSION: No evidence of malignancy.

Patient may return to routine annual bilateral screening mammogram
schedule.

RECOMMENDATION:
Screening mammogram in one year.(Code:WA-5-LU1)

I have discussed the findings and recommendations with the patient.
If applicable, a reminder letter will be sent to the patient
regarding the next appointment.

BI-RADS CATEGORY  1: Negative.

## 2022-10-20 ENCOUNTER — Other Ambulatory Visit: Payer: Self-pay

## 2022-10-20 ENCOUNTER — Telehealth: Payer: Self-pay

## 2022-10-20 DIAGNOSIS — K200 Eosinophilic esophagitis: Secondary | ICD-10-CM

## 2022-10-20 DIAGNOSIS — Z1211 Encounter for screening for malignant neoplasm of colon: Secondary | ICD-10-CM

## 2022-10-20 MED ORDER — NA SULFATE-K SULFATE-MG SULF 17.5-3.13-1.6 GM/177ML PO SOLN: 1.0000 | Freq: Once | ORAL | 0 refills | Status: DC

## 2022-10-20 MED ORDER — NA SULFATE-K SULFATE-MG SULF 17.5-3.13-1.6 GM/177ML PO SOLN: ORAL | 0 refills | Status: DC

## 2022-10-20 NOTE — Telephone Encounter (Signed)
Left message for pt to call back. Trying to schedule pt for ECL at [facility] 11/10/22,  Pt could not do 2/8. Pt scheduled for ECL at [facility] 12/08/22 at 10:30am. Case #-[id number]. Amb ref in epic. Instructions mailed to pt.

## 2022-11-24 ENCOUNTER — Encounter (HOSPITAL_BASED_OUTPATIENT_CLINIC_OR_DEPARTMENT_OTHER): Payer: Self-pay

## 2022-11-24 ENCOUNTER — Emergency Department (HOSPITAL_BASED_OUTPATIENT_CLINIC_OR_DEPARTMENT_OTHER)
Admission: EM | Admit: 2022-11-24 | Discharge: 2022-11-24 | Disposition: A | Discharge status: Home / Self Care | Payer: BC Managed Care – PPO | Attending: Emergency Medicine | Admitting: Emergency Medicine

## 2022-11-24 ENCOUNTER — Other Ambulatory Visit: Payer: Self-pay

## 2022-11-24 DIAGNOSIS — M546 Pain in thoracic spine: Secondary | ICD-10-CM | POA: Diagnosis not present

## 2022-11-24 DIAGNOSIS — J45909 Unspecified asthma, uncomplicated: Secondary | ICD-10-CM | POA: Insufficient documentation

## 2022-11-24 DIAGNOSIS — M25511 Pain in right shoulder: Secondary | ICD-10-CM | POA: Insufficient documentation

## 2022-11-24 DIAGNOSIS — M542 Cervicalgia: Secondary | ICD-10-CM | POA: Insufficient documentation

## 2022-11-24 DIAGNOSIS — Z7951 Long term (current) use of inhaled steroids: Secondary | ICD-10-CM | POA: Diagnosis not present

## 2022-11-24 DIAGNOSIS — M549 Dorsalgia, unspecified: Secondary | ICD-10-CM

## 2022-11-24 MED ORDER — CYCLOBENZAPRINE HCL 10 MG PO TABS: 10.0000 mg | ORAL_TABLET | Freq: Two times a day (BID) | ORAL | 0 refills | Status: DC | PRN

## 2022-11-24 MED ORDER — METHYLPREDNISOLONE 4 MG PO TBPK: ORAL_TABLET | ORAL | 0 refills | Status: DC

## 2022-11-24 NOTE — ED Triage Notes (Signed)
Reports upper mid back and neck pain x6 days. Denies injury.

## 2022-11-24 NOTE — Discharge Instructions (Addendum)
You were seen in the emergency department today for back pain.   I suspect this is likely caused by muscle strain in your neck and back.  Rest, gentle exercise and stretching will aid in recovery from any injuries to your back.  Using medication such as Tylenol and ibuprofen will help alleviate pain as well as decrease swelling and inflammation associated with these injuries. You may use 600 mg ibuprofen every 6 hours or 1000 mg of Tylenol every 6 hours.  You may choose to alternate between the 2.  This would be most effective.  Not to exceed 4 g of Tylenol within 24 hours.  Not to exceed 3200 mg ibuprofen 24 hours.  I have prescribed you: steroids and a muscle relaxer.  Salt water/Epson salt soaks, massage, icy hot/Biofreeze/BenGay and other similar products can help with symptoms.  Please return to the emergency department for reevaluation if you denies any new or concerning symptoms such as fever, new numbness or weakness in your legs, difficulty using the restroom or incontinence.

## 2022-11-24 NOTE — ED Provider Notes (Signed)
[facility] Provider Note   CSN: [id number] Arrival date & time: 11/24/22  1941     History  Chief Complaint  Patient presents with   Back Pain    Christina Robles is a 57 y.o. female who presents to the ER complaining of mid back pain, right sided neck pain and right shoulder pain x 5 days. No injury. Has been trying OTC meds with minimal relief. Hurts worse to lay down to sleep at night. No numbness or weakness. No urinary sx. No fevers or chills.    Back Pain      Home Medications Prior to Admission medications   Medication Sig Start Date End Date Taking? Authorizing Provider  cyclobenzaprine (FLEXERIL) 10 MG tablet Take 1 tablet (10 mg total) by mouth 2 (two) times daily as needed for muscle spasms. 11/24/22  Yes Pollyanna Levay T, PA-C  methylPREDNISolone (MEDROL DOSEPAK) 4 MG TBPK tablet Take per package instructions 11/24/22  Yes Alexsus Papadopoulos T, PA-C  acetaminophen (TYLENOL) 500 MG tablet Take 500 mg by mouth every 6 (six) hours as needed (for pain.).    [provider]  albuterol (PROVENTIL HFA;VENTOLIN HFA) 108 (90 Base) MCG/ACT inhaler Inhale 1-2 puffs into the lungs every 6 (six) hours as needed for wheezing or shortness of breath. 01/16/17   Margarita Mail, PA-C  cetirizine (ZYRTEC) 10 MG tablet Take 10 mg by mouth in the morning.    [provider]  Cholecalciferol (VITAMIN D3) 25 MCG (1000 UT) CAPS  05/25/22   [provider]  fluticasone (FLONASE) 50 MCG/ACT nasal spray Place 2 sprays into both nostrils daily. 11/02/17   Tasia Catchings, Amy V, PA-C  Ibuprofen 200 MG CAPS Take 800 mg by mouth every 8 (eight) hours as needed (pain.).    [provider]  MAGNESIUM PO Take 1 tablet by mouth every evening.    [provider]  meclizine (ANTIVERT) 25 MG tablet Take 25 mg by mouth 2 (two) times daily as needed for dizziness.    [provider]  Mometasone Furoate [facility] HFA) 200 MCG/ACT  AERO 2 puffs into mouth swallowed twice a day.  Do not inhale.  For EOE. 07/21/22   Kennith Gain, MD  montelukast (SINGULAIR) 10 MG tablet Take 10 mg by mouth in the morning.    [provider]  Multiple Vitamin (MULTIVITAMIN WITH MINERALS) TABS tablet Take 1 tablet by mouth in the morning.    [provider]  Na Sulfate-K Sulfate-Mg Sulf 17.5-3.13-1.6 GM/177ML SOLN Take as directed 10/20/22   Daryel November, MD  NONFORMULARY OR COMPOUNDED ITEM Place 600 mg under the tongue in the morning. Peptide Troches [id number]    [provider]  Omega-3 Fatty Acids (OMEGA 3 PO) Take 2 capsules by mouth in the morning and at bedtime. OmegaXL Joint Pain Relief & Inflammation Supplement    [provider]  omeprazole (PRILOSEC) 40 MG capsule Take 1 capsule (40 mg total) by mouth daily. 04/21/22   Gatha Mayer, MD  SYMBICORT 160-4.5 MCG/ACT inhaler Inhale 2 puffs into the lungs 2 (two) times daily. 02/03/22   [provider]      Allergies    Methocarbamol, Cyclobenzaprine, Meloxicam, and Penicillins    Review of Systems   Review of Systems  Musculoskeletal:  Positive for back pain.  All other systems reviewed and are negative.   Physical Exam Updated Vital Signs BP 138/70   Pulse 69   Temp 98 F (36.7  C) (Oral)   Resp 14   Ht '5\' 2"'$  (1.575 m)   Wt 129.7 kg   SpO2 99%   BMI 52.31 kg/m  Physical Exam Vitals and nursing note reviewed.  Constitutional:      Appearance: Normal appearance.  HENT:     Head: Normocephalic and atraumatic.  Eyes:     Conjunctiva/sclera: Conjunctivae normal.  Neck:     Comments: No midline spinal tenderness, step offs or crepitus. Right sided cervical muscle tenderness.  Pulmonary:     Effort: Pulmonary effort is normal. No respiratory distress.  Musculoskeletal:     Comments: Full passive ROM of all regions of spine.  Generalized paraspinal muscular tenderness to palpation in thoracic region with muscle  knot noted on the left.  No midline spinal tenderness, step-offs or crepitus.  Strength 5/5 in all extremities.  Sensation intact in all extremities.  No deformities or tenderness noted to palpation of the R shoulder. Normal ROM.   Skin:    General: Skin is warm and dry.  Neurological:     Mental Status: She is alert.  Psychiatric:        Mood and Affect: Mood normal.        Behavior: Behavior normal.     ED Results / Procedures / Treatments   Labs (all labs ordered are listed, but only abnormal results are displayed) Labs Reviewed - No data to display  EKG None  Radiology No results found.  Procedures Procedures    Medications Ordered in ED Medications - No data to display  ED Course/ Medical Decision Making/ A&P                             Medical Decision Making Risk Prescription drug management.  This patient is a 57 y.o. female who presents to the ED for concern of mid back pain, right neck and shoulder pain.   Differential diagnoses prior to evaluation: Fracture (acute/chronic), muscle strain, cauda equina, spinal stenosis, DDD, ligamentous injury, disk herniation, metastatic cancer, vertebral osteomyelitis, kidney stone, pyelonephritis, AAA, pancreatitis, bowel obstruction, meningitis.  Past Medical History / Social History / Additional history: Chart reviewed. Pertinent results include: asthma, hiatal hernia,   Physical Exam: Physical exam performed. The pertinent findings include: tenderness of paraspinal muscles of left thoracic region, tenderness of right cervical muscles, normal ROM of back and all extremities. Sensation intact.   Disposition: After consideration of the diagnostic results and the patients response to treatment, I feel that emergency department workup does not suggest an emergent condition requiring admission or immediate intervention beyond what has been performed at this time. Patient with back pain.  No neurological deficits and normal  neuro exam.  Patient can walk but states is painful.  No loss of bowel or bladder control.  No concern for cauda equina.  No fever, night sweats, weight loss, h/o cancer, IVDU.  The plan is: discharge to home with steroids and muscle relaxer. Encouraged follow up with PCP if symptoms do not improve. The patient is safe for discharge and has been instructed to return immediately for worsening symptoms, change in symptoms or any other concerns.  Final Clinical Impression(s) / ED Diagnoses Final diagnoses:  Mid back pain  Neck pain on right side  Acute pain of right shoulder    Rx / DC Orders ED Discharge Orders          Ordered    cyclobenzaprine (FLEXERIL) 10 MG tablet  2 times daily PRN        11/24/22 2051    methylPREDNISolone (MEDROL DOSEPAK) 4 MG TBPK tablet        11/24/22 2051           Portions of this report may have been transcribed using voice recognition software. Every effort was made to ensure accuracy; however, inadvertent computerized transcription errors may be present.    Estill Cotta 11/24/22 2332    Davonna Belling, MD 11/25/22 1451

## 2022-11-24 NOTE — ED Notes (Signed)
Reviewed AVS with patient, patient expressed understanding of directions, denies further questions at this time.

## 2022-12-01 ENCOUNTER — Encounter (HOSPITAL_COMMUNITY): Payer: Self-pay | Admitting: Gastroenterology

## 2022-12-08 ENCOUNTER — Ambulatory Visit (HOSPITAL_COMMUNITY): Payer: BC Managed Care – PPO | Admitting: Anesthesiology

## 2022-12-08 ENCOUNTER — Encounter (HOSPITAL_COMMUNITY)
Admission: RE | Disposition: A | Discharge status: Home / Self Care | Payer: Self-pay | Source: Home / Self Care | Attending: Gastroenterology

## 2022-12-08 ENCOUNTER — Ambulatory Visit (HOSPITAL_COMMUNITY)
Admission: RE | Admit: 2022-12-08 | Discharge: 2022-12-08 | Disposition: A | Discharge status: Home / Self Care | Payer: BC Managed Care – PPO | Attending: Gastroenterology | Admitting: Gastroenterology

## 2022-12-08 ENCOUNTER — Other Ambulatory Visit: Payer: Self-pay

## 2022-12-08 ENCOUNTER — Encounter (HOSPITAL_COMMUNITY): Payer: Self-pay | Admitting: Gastroenterology

## 2022-12-08 DIAGNOSIS — Z1211 Encounter for screening for malignant neoplasm of colon: Secondary | ICD-10-CM

## 2022-12-08 DIAGNOSIS — K5730 Diverticulosis of large intestine without perforation or abscess without bleeding: Secondary | ICD-10-CM | POA: Diagnosis not present

## 2022-12-08 DIAGNOSIS — I10 Essential (primary) hypertension: Secondary | ICD-10-CM | POA: Insufficient documentation

## 2022-12-08 DIAGNOSIS — K317 Polyp of stomach and duodenum: Secondary | ICD-10-CM | POA: Diagnosis not present

## 2022-12-08 DIAGNOSIS — K200 Eosinophilic esophagitis: Secondary | ICD-10-CM | POA: Insufficient documentation

## 2022-12-08 DIAGNOSIS — Z6841 Body Mass Index (BMI) 40.0 and over, adult: Secondary | ICD-10-CM | POA: Diagnosis not present

## 2022-12-08 DIAGNOSIS — Z87891 Personal history of nicotine dependence: Secondary | ICD-10-CM | POA: Diagnosis not present

## 2022-12-08 DIAGNOSIS — Z09 Encounter for follow-up examination after completed treatment for conditions other than malignant neoplasm: Secondary | ICD-10-CM | POA: Diagnosis not present

## 2022-12-08 DIAGNOSIS — J45909 Unspecified asthma, uncomplicated: Secondary | ICD-10-CM | POA: Insufficient documentation

## 2022-12-08 DIAGNOSIS — D122 Benign neoplasm of ascending colon: Secondary | ICD-10-CM

## 2022-12-08 DIAGNOSIS — D127 Benign neoplasm of rectosigmoid junction: Secondary | ICD-10-CM

## 2022-12-08 DIAGNOSIS — Z79899 Other long term (current) drug therapy: Secondary | ICD-10-CM | POA: Diagnosis not present

## 2022-12-08 DIAGNOSIS — K2100 Gastro-esophageal reflux disease with esophagitis, without bleeding: Secondary | ICD-10-CM | POA: Diagnosis not present

## 2022-12-08 HISTORY — PX: BIOPSY: SHX5522

## 2022-12-08 HISTORY — PX: COLONOSCOPY WITH PROPOFOL: SHX5780

## 2022-12-08 HISTORY — PX: POLYPECTOMY: SHX5525

## 2022-12-08 HISTORY — PX: ESOPHAGOGASTRODUODENOSCOPY (EGD) WITH PROPOFOL: SHX5813

## 2022-12-08 SURGERY — ESOPHAGOGASTRODUODENOSCOPY (EGD) WITH PROPOFOL
Anesthesia: Monitor Anesthesia Care

## 2022-12-08 MED ORDER — SODIUM CHLORIDE 0.9 % IV SOLN: INTRAVENOUS | Status: DC

## 2022-12-08 MED ORDER — PROPOFOL 500 MG/50ML IV EMUL
INTRAVENOUS | Status: DC | PRN
  Administered 2022-12-08: 150 ug/kg/min via INTRAVENOUS

## 2022-12-08 MED ORDER — PROPOFOL 1000 MG/100ML IV EMUL
INTRAVENOUS | Status: AC
  Filled 2022-12-08: qty 100

## 2022-12-08 MED ORDER — PROPOFOL 10 MG/ML IV BOLUS
INTRAVENOUS | Status: DC | PRN
  Administered 2022-12-08: 30 mg via INTRAVENOUS
  Administered 2022-12-08: 25 mg via INTRAVENOUS
  Administered 2022-12-08: 30 mg via INTRAVENOUS
  Administered 2022-12-08 (×2): 20 mg via INTRAVENOUS

## 2022-12-08 MED ORDER — LACTATED RINGERS IV SOLN: INTRAVENOUS | Status: DC

## 2022-12-08 MED ORDER — PROPOFOL 500 MG/50ML IV EMUL
INTRAVENOUS | Status: AC
  Filled 2022-12-08: qty 200

## 2022-12-08 MED ORDER — LIDOCAINE 2% (20 MG/ML) 5 ML SYRINGE
INTRAMUSCULAR | Status: DC | PRN
  Administered 2022-12-08: 40 mg via INTRAVENOUS
  Administered 2022-12-08: 60 mg via INTRAVENOUS

## 2022-12-08 SURGICAL SUPPLY — 25 items

## 2022-12-08 NOTE — Anesthesia Preprocedure Evaluation (Signed)
Anesthesia Evaluation  Patient identified by MRN, date of birth, ID band Patient awake    Reviewed: Allergy & Precautions, H&P , NPO status , Patient's Chart, lab work & pertinent test results  Airway Mallampati: II  TM Distance: >3 FB Neck ROM: Full    Dental no notable dental hx.    Pulmonary asthma , former smoker   breath sounds clear to auscultation + decreased breath sounds      Cardiovascular hypertension, Normal cardiovascular exam Rhythm:Regular Rate:Normal     Neuro/Psych negative neurological ROS  negative psych ROS   GI/Hepatic negative GI ROS, Neg liver ROS,,,  Endo/Other    Morbid obesity  Renal/GU negative Renal ROS  negative genitourinary   Musculoskeletal negative musculoskeletal ROS (+)    Abdominal  (+) + obese  Peds negative pediatric ROS (+)  Hematology negative hematology ROS (+)   Anesthesia Other Findings   Reproductive/Obstetrics negative OB ROS                             Anesthesia Physical Anesthesia Plan  ASA: 3  Anesthesia Plan: MAC   Post-op Pain Management: Minimal or no pain anticipated   Induction: Intravenous  PONV Risk Score and Plan: 2 and Propofol infusion and Treatment may vary due to age or medical condition  Airway Management Planned: Simple Face Mask  Additional Equipment:   Intra-op Plan:   Post-operative Plan:   Informed Consent: I have reviewed the patients History and Physical, chart, labs and discussed the procedure including the risks, benefits and alternatives for the proposed anesthesia with the patient or authorized representative who has indicated his/her understanding and acceptance.     Dental advisory given  Plan Discussed with: CRNA and Surgeon  Anesthesia Plan Comments:        Anesthesia Quick Evaluation

## 2022-12-08 NOTE — Anesthesia Postprocedure Evaluation (Signed)
Anesthesia Post Note  Patient: Christina Robles  Procedure(s) Performed: ESOPHAGOGASTRODUODENOSCOPY (EGD) WITH PROPOFOL COLONOSCOPY WITH PROPOFOL BIOPSY POLYPECTOMY     Patient location during evaluation: PACU Anesthesia Type: MAC Level of consciousness: awake and alert Pain management: pain level controlled Vital Signs Assessment: post-procedure vital signs reviewed and stable Respiratory status: spontaneous breathing, nonlabored ventilation, respiratory function stable and patient connected to nasal cannula oxygen Cardiovascular status: stable and blood pressure returned to baseline Postop Assessment: no apparent nausea or vomiting Anesthetic complications: no  No notable events documented.  Last Vitals:  Vitals:   12/08/22 1152 12/08/22 1154  BP: (!) 154/93 (!) 150/86  Pulse: 79 72  Resp: 17 (!) 9  Temp:    SpO2: 97% 94%    Last Pain:  Vitals:   12/08/22 1149  TempSrc:   PainSc: 0-No pain                 Celie Desrochers S

## 2022-12-08 NOTE — Discharge Instructions (Signed)
YOU HAD AN ENDOSCOPIC PROCEDURE TODAY: Refer to the procedure report and other information in the discharge instructions given to you for any specific questions about what was found during the examination. If this information does not answer your questions, please call [place] office at [phone] to clarify.  ° °YOU SHOULD EXPECT: Some feelings of bloating in the abdomen. Passage of more gas than usual. Walking can help get rid of the air that was put into your GI tract during the procedure and reduce the bloating. If you had a lower endoscopy (such as a colonoscopy or flexible sigmoidoscopy) you may notice spotting of blood in your stool or on the toilet paper. Some abdominal soreness may be present for a day or two, also. ° °DIET: Your first meal following the procedure should be a light meal and then it is ok to progress to your normal diet. A half-sandwich or bowl of soup is an example of a good first meal. Heavy or fried foods are harder to digest and may make you feel nauseous or bloated. Drink plenty of fluids but you should avoid alcoholic beverages for 24 hours. If you had a esophageal dilation, please see attached instructions for diet.   ° °ACTIVITY: Your care partner should take you home directly after the procedure. You should plan to take it easy, moving slowly for the rest of the day. You can resume normal activity the day after the procedure however YOU SHOULD NOT DRIVE, use power tools, machinery or perform tasks that involve climbing or major physical exertion for 24 hours (because of the sedation medicines used during the test).  ° °SYMPTOMS TO REPORT IMMEDIATELY: °A gastroenterologist can be reached at any hour. Please call [phone]  for any of the following symptoms:  °Following lower endoscopy (colonoscopy, flexible sigmoidoscopy) °Excessive amounts of blood in the stool  °Significant tenderness, worsening of abdominal pains  °Swelling of the abdomen that is new, acute  °Fever of 100° or  higher  °Following upper endoscopy (EGD, EUS, ERCP, esophageal dilation) °Vomiting of blood or coffee ground material  °New, significant abdominal pain  °New, significant chest pain or pain under the shoulder blades  °Painful or persistently difficult swallowing  °New shortness of breath  °Black, tarry-looking or red, bloody stools ° °FOLLOW UP:  °If any biopsies were taken you will be contacted by phone or by letter within the next 1-3 weeks. Call [phone]  if you have not heard about the biopsies in 3 weeks.  °Please also call with any specific questions about appointments or follow up tests. ° °

## 2022-12-08 NOTE — Transfer of Care (Signed)
Immediate Anesthesia Transfer of Care Note  Patient: Christina Robles  Procedure(s) Performed: ESOPHAGOGASTRODUODENOSCOPY (EGD) WITH PROPOFOL COLONOSCOPY WITH PROPOFOL BIOPSY POLYPECTOMY  Patient Location: PACU  Anesthesia Type:MAC  Level of Consciousness: sedated  Airway & Oxygen Therapy: Patient Spontanous Breathing and Patient connected to face mask oxygen  Post-op Assessment: Report given to RN and Post -op Vital signs reviewed and stable  Post vital signs: Reviewed and stable  Last Vitals:  Vitals Value Taken Time  BP 125/60 12/08/22 1132  Temp    Pulse 85 12/08/22 1134  Resp 25 12/08/22 1134  SpO2 100 % 12/08/22 1134  Vitals shown include unvalidated device data.  Last Pain:  Vitals:   12/08/22 0920  TempSrc: Temporal  PainSc: 0-No pain         Complications: No notable events documented.

## 2022-12-08 NOTE — Op Note (Signed)
[facility] Patient Name: Christina Robles Procedure Date: 12/08/2022 MRN: [id number] Attending MD: Gladstone Pih. Candis Schatz , MD, [id number] Date of Birth: [birth year] CSN: [id number] Age: 57 Admit Type: Outpatient Procedure:                Colonoscopy Indications:              Screening for colorectal malignant neoplasm, This                            is the patient's first colonoscopy Providers:                Nicki Reaper E. Candis Schatz, MD, Fanny Skates RN, RN,                            Luan Moore, Technician, [place] CRNA,                            CRNA Referring MD:              Medicines:                Monitored Anesthesia Care Complications:            No immediate complications. Estimated Blood Loss:     Estimated blood loss was minimal. Procedure:                Pre-Anesthesia Assessment:                           - Prior to the procedure, a History and Physical                            was performed, and patient medications and                            allergies were reviewed. The patient's tolerance of                            previous anesthesia was also reviewed. The risks                            and benefits of the procedure and the sedation                            options and risks were discussed with the patient.                            All questions were answered, and informed consent                            was obtained. Prior Anticoagulants: The patient has                            taken no anticoagulant or antiplatelet agents. ASA  Grade Assessment: III - A patient with severe                            systemic disease. After reviewing the risks and                            benefits, the patient was deemed in satisfactory                            condition to undergo the procedure.                           - Prior to the procedure, a History and Physical                            was performed,  and patient medications and                            allergies were reviewed. The patient's tolerance of                            previous anesthesia was also reviewed. The risks                            and benefits of the procedure and the sedation                            options and risks were discussed with the patient.                            All questions were answered, and informed consent                            was obtained. Prior Anticoagulants: The patient has                            taken no anticoagulant or antiplatelet agents. ASA                            Grade Assessment: III - A patient with severe                            systemic disease. After reviewing the risks and                            benefits, the patient was deemed in satisfactory                            condition to undergo the procedure.                           After obtaining informed consent, the colonoscope  was passed under direct vision. Throughout the                            procedure, the patient's blood pressure, pulse, and                            oxygen saturations were monitored continuously. The                            [id number] Olympus colonoscope was                            introduced through the anus and advanced to the the                            terminal ileum, with identification of the                            appendiceal orifice and IC valve. The colonoscopy                            was performed without difficulty. The patient                            tolerated the procedure well. The quality of the                            bowel preparation was excellent. The terminal                            ileum, ileocecal valve, appendiceal orifice, and                            rectum were photographed. Scope In: [id number] Scope Out: 11:27:53 AM Scope Withdrawal Time: 0 hours 18 minutes 8 seconds  Total  Procedure Duration: 0 hours 20 minutes 56 seconds  Findings:      The perianal and digital rectal examinations were normal. Pertinent       negatives include normal sphincter tone and no palpable rectal lesions.      A 5 mm polyp was found in the ascending colon. The polyp was sessile.       The polyp was removed with a cold snare. Resection and retrieval were       complete. Estimated blood loss was minimal.      A 4 mm polyp was found in the recto-sigmoid colon. The polyp was       sessile. The polyp was removed with a cold snare. Resection and       retrieval were complete. Estimated blood loss was minimal.      Many large-mouthed and small-mouthed diverticula were found in the       sigmoid colon, descending colon and ascending colon. There was no       evidence of diverticular bleeding.      The exam was otherwise normal throughout the examined colon.      The terminal ileum appeared normal.  The retroflexed view of the distal rectum and anal verge was normal and       showed no anal or rectal abnormalities. Impression:               - One 5 mm polyp in the ascending colon, removed                            with a cold snare. Resected and retrieved.                           - One 4 mm polyp at the recto-sigmoid colon,                            removed with a cold snare. Resected and retrieved.                           - Moderate diverticulosis in the sigmoid colon, in                            the descending colon and in the ascending colon.                            There was no evidence of diverticular bleeding.                           - The examined portion of the ileum was normal.                           - The distal rectum and anal verge are normal on                            retroflexion view. Moderate Sedation:      Not Applicable - Patient had care per Anesthesia. Recommendation:           - Patient has a contact number available for                             emergencies. The signs and symptoms of potential                            delayed complications were discussed with the                            patient. Return to normal activities tomorrow.                            Written discharge instructions were provided to the                            patient.                           - Resume previous diet.                           -  Continue present medications.                           - Await pathology results.                           - Repeat colonoscopy (date not yet determined) for                            surveillance based on pathology results. Procedure Code(s):        --- Professional ---                           [phone], Colonoscopy, flexible; with removal of                            tumor(s), polyp(s), or other lesion(s) by snare                            technique Diagnosis Code(s):        --- Professional ---                           Z12.11, Encounter for screening for malignant                            neoplasm of colon                           D12.2, Benign neoplasm of ascending colon                           D12.7, Benign neoplasm of rectosigmoid junction                           K57.30, Diverticulosis of large intestine without                            perforation or abscess without bleeding CPT copyright 2022 [employer]. All rights reserved. The codes documented in this report are preliminary and upon coder review may  be revised to meet current compliance requirements. Makinlee Awwad E. Candis Schatz, MD 12/08/2022 11:38:37 AM This report has been signed electronically. Number of Addenda: 0

## 2022-12-08 NOTE — Anesthesia Procedure Notes (Signed)
Procedure Name: MAC Date/Time: 12/08/2022 10:56 AM  Performed by: Lind Covert, CRNAPre-anesthesia Checklist: Patient identified, Emergency Drugs available, Suction available and Patient being monitored Patient Re-evaluated:Patient Re-evaluated prior to induction Oxygen Delivery Method: Simple face mask Preoxygenation: Pre-oxygenation with 100% oxygen Induction Type: IV induction Airway Equipment and Method: Bite block Placement Confirmation: positive ETCO2 and breath sounds checked- equal and bilateral Dental Injury: Teeth and Oropharynx as per pre-operative assessment

## 2022-12-08 NOTE — Op Note (Signed)
[facility] Patient Name: Christina Robles Procedure Date: 12/08/2022 MRN: [id number] Attending MD: Gladstone Pih. Candis Schatz , MD, [id number] Date of Birth: 1966/03/12 CSN: [id number] Age: 57 Admit Type: Outpatient Procedure:                Upper GI endoscopy Indications:              Follow-up of eosinophilic esophagitis; she is                            taking mometasone inhaler BID and omeprazole once                            daily without dysphagia Providers:                Nicki Reaper E. Candis Schatz, MD, Fanny Skates RN, RN,                            Luan Moore, Technician, Evadale Alday CRNA,                            CRNA Referring MD:              Medicines:                Monitored Anesthesia Care Complications:            No immediate complications. Estimated Blood Loss:     Estimated blood loss was minimal. Procedure:                Pre-Anesthesia Assessment:                           - Prior to the procedure, a History and Physical                            was performed, and patient medications and                            allergies were reviewed. The patient's tolerance of                            previous anesthesia was also reviewed. The risks                            and benefits of the procedure and the sedation                            options and risks were discussed with the patient.                            All questions were answered, and informed consent                            was obtained. Prior Anticoagulants: The patient has  taken no anticoagulant or antiplatelet agents. ASA                            Grade Assessment: III - A patient with severe                            systemic disease. After reviewing the risks and                            benefits, the patient was deemed in satisfactory                            condition to undergo the procedure.                           After obtaining  informed consent, the endoscope was                            passed under direct vision. Throughout the                            procedure, the patient's blood pressure, pulse, and                            oxygen saturations were monitored continuously. The                            [id number] Olympus endoscope was introduced                            through the mouth, and advanced to the second part                            of duodenum. The upper GI endoscopy was                            accomplished without difficulty. The patient                            tolerated the procedure well. Scope In: Scope Out: Findings:      The examined esophagus was normal. Biopsies were obtained from the       proximal and distal esophagus with cold forceps for histology of       suspected eosinophilic esophagitis. Estimated blood loss was minimal.      Two small sessile polyps were found in the cardia. Biopsies were taken       with a cold forceps for histology. Estimated blood loss was minimal.      The exam of the stomach was otherwise normal.      The examined duodenum was normal. Impression:               - Normal esophagus.                           - Two gastric polyps. Biopsied.                           -  Normal examined duodenum.                           - Biopsies were taken with a cold forceps for                            evaluation of eosinophilic esophagitis. Moderate Sedation:      Not Applicable - Patient had care per Anesthesia. Recommendation:           - Patient has a contact number available for                            emergencies. The signs and symptoms of potential                            delayed complications were discussed with the                            patient. Return to normal activities tomorrow.                            Written discharge instructions were provided to the                            patient.                           -  Resume previous diet.                           - Continue present medications.                           - Await pathology results.                           - Follow up in the office in one year. Procedure Code(s):        --- Professional ---                           [phone], Esophagogastroduodenoscopy, flexible,                            transoral; with biopsy, single or multiple Diagnosis Code(s):        --- Professional ---                           K31.7, Polyp of stomach and duodenum                           [id number], Eosinophilic esophagitis CPT copyright 2022 American Medical Association. All rights reserved. The codes documented in this report are preliminary and upon coder review may  be revised to meet current compliance requirements. Florencio Hollibaugh E. Candis Schatz, MD 12/08/2022 11:34:18 AM This report has been signed electronically. Number of Addenda: 0

## 2022-12-08 NOTE — H&P (Signed)
[facility] History and Physical   Primary Care Physician:  Vonna Drafts, FNP   Reason for Procedure:   Follow up EoE, colon cancer screening  Plan:    EGD, colonoscopy     HPI: Christina Robles is a 57 y.o. female undergoing initial average risk screening colonoscopy and repeat EGD to reassess EoE.  She is taking omeprazole daily and mometasone twice daily and has little to no dysphagia.  She has no family history of colon cancer.    Past Medical History:  Diagnosis Date   Asthma    Blood clot in abdominal vein    Eczema    Eosinophilic esophagitis [id number]   Hiatal hernia    Uterine polyp     Past Surgical History:  Procedure Laterality Date   BIOPSY  04/19/2022   Procedure: BIOPSY;  Surgeon: Gatha Mayer, MD;  Location: [facility];  Service: Gastroenterology;;   Blood clot     After a pregnancy 26 years ago   ESOPHAGEAL DILATION  04/19/2022   Procedure: ESOPHAGEAL DILATION;  Surgeon: Gatha Mayer, MD;  Location: [facility];  Service: Gastroenterology;;   ESOPHAGOGASTRODUODENOSCOPY (EGD) WITH PROPOFOL N/A 04/19/2022   Procedure: ESOPHAGOGASTRODUODENOSCOPY (EGD) WITH PROPOFOL;  Surgeon: Gatha Mayer, MD;  Location: [facility];  Service: Gastroenterology;  Laterality: N/A;   TUBAL LIGATION      Prior to Admission medications   Medication Sig Start Date End Date Taking? Authorizing Provider  acetaminophen (TYLENOL) 500 MG tablet Take 500 mg by mouth every 6 (six) hours as needed (for pain.).   Yes [provider]  Calcium Carbonate (CALCIUM 600 PO) Take 600 mg by mouth daily.   Yes [provider]  Cholecalciferol 25 MCG (1000 UT) tablet Take 2,000 Units by mouth daily. 05/25/22  Yes [provider]  cyclobenzaprine (FLEXERIL) 10 MG tablet Take 1 tablet (10 mg total) by mouth 2 (two) times daily as needed for muscle spasms. 11/24/22  Yes Roemhildt, Lorin T, PA-C  fexofenadine-pseudoephedrine (ALLEGRA-D 24) 180-240 MG  24 hr tablet Take 1 tablet by mouth daily as needed (allergies).   Yes [provider]  ibuprofen (ADVIL) 200 MG tablet Take 600-800 mg by mouth every 6 (six) hours as needed for moderate pain or headache.   Yes [provider]  Mometasone Furoate (ASMANEX HFA) 200 MCG/ACT AERO 2 puffs into mouth swallowed twice a day.  Do not inhale.  For EOE. 07/21/22  Yes Padgett, Rae Halsted, MD  montelukast (SINGULAIR) 10 MG tablet Take 10 mg by mouth in the morning.   Yes [provider]  Omega-3 Fatty Acids (OMEGA 3 PO) Take 2 capsules by mouth in the morning and at bedtime. OmegaXL Joint Pain Relief & Inflammation Supplement   Yes [provider]  omeprazole (PRILOSEC) 40 MG capsule Take 1 capsule (40 mg total) by mouth daily. 04/21/22  Yes Gatha Mayer, MD  SYMBICORT 160-4.5 MCG/ACT inhaler Inhale 2 puffs into the lungs 2 (two) times daily. 02/03/22  Yes [provider]  albuterol (PROVENTIL HFA;VENTOLIN HFA) 108 (90 Base) MCG/ACT inhaler Inhale 1-2 puffs into the lungs every 6 (six) hours as needed for wheezing or shortness of breath. 01/16/17   Harris, Abigail, PA-C  fluticasone (FLONASE) 50 MCG/ACT nasal spray Place 2 sprays into both nostrils daily. Patient taking differently: Place 2 sprays into both nostrils daily as needed for allergies. 11/02/17   Tasia Catchings, Amy V, PA-C  furosemide (LASIX) 20 MG tablet Take 20 mg by mouth daily as  needed for fluid.    [provider]  meclizine (ANTIVERT) 25 MG tablet Take 25 mg by mouth 2 (two) times daily as needed for dizziness. Patient not taking: Reported on 12/08/2022    [provider]  methylPREDNISolone (MEDROL DOSEPAK) 4 MG TBPK tablet Take per package instructions Patient not taking: Reported on 12/06/2022 11/24/22   Roemhildt, Lorin T, PA-C  Na Sulfate-K Sulfate-Mg Sulf 17.5-3.13-1.6 GM/177ML SOLN Take as directed Patient not taking: Reported on 12/06/2022 10/20/22   Daryel November, MD    Current  Facility-Administered Medications  Medication Dose Route Frequency Provider Last Rate Last Admin   0.9 %  sodium chloride infusion   Intravenous Continuous Daryel November, MD       lactated ringers infusion   Intravenous Continuous Daryel November, MD 50 mL/hr at 12/08/22 1047 Continued from Pre-op at 12/08/22 1047    Allergies as of 10/20/2022 - Review Complete 07/21/2022  Allergen Reaction Noted   Methocarbamol Anaphylaxis 05/26/2014   Cyclobenzaprine Nausea And Vomiting 11/22/2020   Meloxicam Nausea And Vomiting 05/26/2014   Penicillins Nausea And Vomiting and Other (See Comments) 09/26/2011    Family History  Problem Relation Age of Onset   Asthma Mother    Hypertension Father    Asthma Sister    Asthma Brother    Diabetes Cousin    Colon cancer Neg Hx    Esophageal cancer Neg Hx    Pancreatic cancer Neg Hx    Stomach cancer Neg Hx     Social History   Socioeconomic History   Marital status: Single    Spouse name: Not on file   Number of children: 4   Years of education: Not on file   Highest education level: Not on file  Occupational History   Occupation: EKG Engineer, production: [employer]    Comment: [employer].  Tobacco Use   Smoking status: Former    Packs/day: 0.30    Years: 20.00    Total pack years: 6.00    Types: Cigarettes    Quit date: 02/24/2011    Years since quitting: 11.7    Passive exposure: Past   Smokeless tobacco: Never  Vaping Use   Vaping Use: Never used  Substance and Sexual Activity   Alcohol use: Yes    Comment: occasionally   Drug use: No   Sexual activity: Not on file  Other Topics Concern   Not on file  Social History Narrative   RT at [employer]   Social Determinants of Health   Financial Resource Strain: Not on file  Food Insecurity: Not on file  Transportation Needs: Not on file  Physical Activity: Not on file  Stress: Not on file  Social Connections: Not on file  Intimate  Partner Violence: Not on file    Review of Systems:  All other review of systems negative except as mentioned in the HPI.  Physical Exam: Vital signs BP 138/61   Pulse 82   Temp (!) 97.5 F (36.4 C) (Temporal)   Resp 15   Ht '5\' 2"'$  (1.575 m)   Wt 128.4 kg   SpO2 100%   BMI 51.76 kg/m   General:   Alert,  Well-developed, well-nourished, pleasant and cooperative in NAD Airway:  Mallampati 2 Lungs:  Clear throughout to auscultation.   Heart:  Regular rate and rhythm; no murmurs, clicks, rubs,  or gallops. Abdomen:  Soft, nontender and nondistended. Normal bowel sounds.   Neuro/Psych:  Normal mood and affect. A and O x 3   Laylaa Guevarra E. Candis Schatz, MD [facility]

## 2022-12-09 LAB — SURGICAL PATHOLOGY

## 2022-12-11 ENCOUNTER — Encounter (HOSPITAL_COMMUNITY): Payer: Self-pay | Admitting: Gastroenterology

## 2022-12-11 NOTE — Progress Notes (Signed)
Ms. Armento,  The biopsies of the polypoid lesions at the top of your stomach were unremarkable.  There were no precancerous changes seen. The biopsies of your esophagus show that your eosinophilic esophagitis is well controlled with your current medications.  I would recommend you continue these medications as you are currently doing. Please follow up with me in the office in 1 year, sooner  if your symptoms return.  The two polyps which I removed during your recent procedure were proven to be completely benign but are considered "pre-cancerous" polyps that MAY have grown into cancer if they had not been removed.  Studies shows that at least 20% of women over age 74 and 30% of men over age 10 have pre-cancerous polyps.  Based on current nationally recognized surveillance guidelines, I recommend that you have a repeat colonoscopy in 7 years.   If you develop any new rectal bleeding, abdominal pain or significant bowel habit changes, please contact me before then.

## 2023-05-04 ENCOUNTER — Other Ambulatory Visit: Payer: Self-pay | Admitting: Internal Medicine

## 2023-05-04 DIAGNOSIS — K200 Eosinophilic esophagitis: Secondary | ICD-10-CM

## 2023-07-30 ENCOUNTER — Emergency Department (HOSPITAL_BASED_OUTPATIENT_CLINIC_OR_DEPARTMENT_OTHER)
Admission: EM | Admit: 2023-07-30 | Discharge: 2023-07-30 | Disposition: A | Discharge status: Home / Self Care | Payer: BC Managed Care – PPO | Attending: Emergency Medicine | Admitting: Emergency Medicine

## 2023-07-30 ENCOUNTER — Encounter (HOSPITAL_BASED_OUTPATIENT_CLINIC_OR_DEPARTMENT_OTHER): Payer: Self-pay | Admitting: Emergency Medicine

## 2023-07-30 ENCOUNTER — Emergency Department (HOSPITAL_BASED_OUTPATIENT_CLINIC_OR_DEPARTMENT_OTHER): Payer: BC Managed Care – PPO | Admitting: Radiology

## 2023-07-30 ENCOUNTER — Other Ambulatory Visit: Payer: Self-pay

## 2023-07-30 DIAGNOSIS — N3000 Acute cystitis without hematuria: Secondary | ICD-10-CM | POA: Diagnosis not present

## 2023-07-30 DIAGNOSIS — M545 Low back pain, unspecified: Secondary | ICD-10-CM | POA: Insufficient documentation

## 2023-07-30 DIAGNOSIS — R1031 Right lower quadrant pain: Secondary | ICD-10-CM

## 2023-07-30 LAB — URINALYSIS, ROUTINE W REFLEX MICROSCOPIC
Bilirubin Urine: NEGATIVE
Glucose, UA: NEGATIVE mg/dL
Ketones, ur: NEGATIVE mg/dL
Nitrite: NEGATIVE
Specific Gravity, Urine: 1.033 — ABNORMAL HIGH (ref 1.005–1.030)
pH: 5.5 (ref 5.0–8.0)

## 2023-07-30 MED ORDER — NAPROXEN 250 MG PO TABS
500.0000 mg | ORAL_TABLET | Freq: Once | ORAL | Status: AC
  Administered 2023-07-30: 500 mg via ORAL
  Filled 2023-07-30: qty 2

## 2023-07-30 MED ORDER — NAPROXEN 500 MG PO TABS: 500.0000 mg | ORAL_TABLET | Freq: Two times a day (BID) | ORAL | 0 refills | Status: DC

## 2023-07-30 MED ORDER — CYCLOBENZAPRINE HCL 10 MG PO TABS
10.0000 mg | ORAL_TABLET | Freq: Once | ORAL | Status: AC
  Administered 2023-07-30: 10 mg via ORAL
  Filled 2023-07-30: qty 1

## 2023-07-30 MED ORDER — HYDROMORPHONE HCL 1 MG/ML IJ SOLN
1.0000 mg | Freq: Once | INTRAMUSCULAR | Status: AC
  Administered 2023-07-30: 1 mg via INTRAMUSCULAR
  Filled 2023-07-30: qty 1

## 2023-07-30 MED ORDER — CYCLOBENZAPRINE HCL 10 MG PO TABS: 10.0000 mg | ORAL_TABLET | Freq: Two times a day (BID) | ORAL | 0 refills | Status: AC | PRN

## 2023-07-30 MED ORDER — CEPHALEXIN 500 MG PO CAPS: 500.0000 mg | ORAL_CAPSULE | Freq: Two times a day (BID) | ORAL | 0 refills | Status: AC

## 2023-07-30 MED ORDER — CEPHALEXIN 250 MG PO CAPS
500.0000 mg | ORAL_CAPSULE | Freq: Once | ORAL | Status: AC
  Administered 2023-07-30: 500 mg via ORAL
  Filled 2023-07-30: qty 2

## 2023-07-30 NOTE — ED Provider Notes (Signed)
[place] EMERGENCY DEPARTMENT AT [facility]  Provider Note  CSN: [id number] Arrival date & time: 07/30/23 0149  History Chief Complaint  Patient presents with   Back Pain    Christina Robles is a 57 y.o. female brought to the ED by son for evaluation of R lower back and R hip pain, began in the morning of 10/26 when she woke up, mostly in R groin area, worse with movement. Seen at [facility] and given Toradol injection as well as Rx for steroids which she did not pick up yet. She took a nap when she got home and woke up with worsened pain and now also in her lower back. No dysuria, hematuria, fever. N/V/D, no weakness/numbness in leg. Pain does not radiate into leg. No IVDA or prior surgeries.    Home Medications Prior to Admission medications   Medication Sig Start Date End Date Taking? Authorizing Provider  albuterol (PROVENTIL HFA;VENTOLIN HFA) 108 (90 Base) MCG/ACT inhaler Inhale 1-2 puffs into the lungs every 6 (six) hours as needed for wheezing or shortness of breath. 01/16/17  Yes Harris, Abigail, PA-C  Calcium Carbonate (CALCIUM 600 PO) Take 600 mg by mouth daily.   Yes [provider]  cephALEXin (KEFLEX) 500 MG capsule Take 1 capsule (500 mg total) by mouth 2 (two) times daily for 7 days. 07/30/23 08/06/23 Yes Pollyann Savoy, MD  Cholecalciferol 25 MCG (1000 UT) tablet Take 2,000 Units by mouth daily. 05/25/22  Yes [provider]  Mometasone Furoate (ASMANEX HFA) 200 MCG/ACT AERO 2 puffs into mouth swallowed twice a day.  Do not inhale.  For EOE. 07/21/22  Yes Padgett, Pilar Grammes, MD  montelukast (SINGULAIR) 10 MG tablet Take 10 mg by mouth in the morning.   Yes [provider]  naproxen (NAPROSYN) 500 MG tablet Take 1 tablet (500 mg total) by mouth 2 (two) times daily. 07/30/23  Yes Pollyann Savoy, MD  omeprazole (PRILOSEC) 40 MG capsule TAKE 1 CAPSULE (40 MG TOTAL) BY MOUTH DAILY. 05/04/23  Yes Iva Boop, MD  SYMBICORT 160-4.5  MCG/ACT inhaler Inhale 2 puffs into the lungs 2 (two) times daily. 02/03/22  Yes [provider]  cyclobenzaprine (FLEXERIL) 10 MG tablet Take 1 tablet (10 mg total) by mouth 2 (two) times daily as needed for muscle spasms. 07/30/23   Pollyann Savoy, MD  furosemide (LASIX) 20 MG tablet Take 20 mg by mouth daily as needed for fluid.    [provider]     Allergies    Robaxin [methocarbamol] and Penicillins   Review of Systems   Review of Systems Please see HPI for pertinent positives and negatives  Physical Exam BP 127/77   Pulse 74   Resp 18   Ht 5\' 2"  (1.575 m)   Wt 135.2 kg   SpO2 94%   BMI 54.50 kg/m   Physical Exam Vitals and nursing note reviewed.  Constitutional:      Appearance: Normal appearance.  HENT:     Head: Normocephalic and atraumatic.     Nose: Nose normal.     Mouth/Throat:     Mouth: Mucous membranes are moist.  Eyes:     Extraocular Movements: Extraocular movements intact.     Conjunctiva/sclera: Conjunctivae normal.  Cardiovascular:     Rate and Rhythm: Normal rate.  Pulmonary:     Effort: Pulmonary effort is normal.     Breath sounds: Normal breath sounds.  Abdominal:     General: Abdomen is flat.  Palpations: Abdomen is soft.     Tenderness: There is no abdominal tenderness.  Musculoskeletal:        General: No swelling. Normal range of motion.     Cervical back: Neck supple.     Comments: Patient has some tenderness in R groin soft tissues and R buttock. Pain is worse with ROM of hip but no deformity  Skin:    General: Skin is warm and dry.  Neurological:     General: No focal deficit present.     Mental Status: She is alert.  Psychiatric:        Mood and Affect: Mood normal.     ED Results / Procedures / Treatments   EKG None  Procedures Procedures  Medications Ordered in the ED Medications  cephALEXin (KEFLEX) capsule 500 mg (has no administration in time range)  HYDROmorphone (DILAUDID) injection 1  mg (1 mg Intramuscular Given 07/30/23 0234)  naproxen (NAPROSYN) tablet 500 mg (500 mg Oral Given 07/30/23 0532)  cyclobenzaprine (FLEXERIL) tablet 10 mg (10 mg Oral Given 07/30/23 0532)    Initial Impression and Plan  Patient here with likely MSK back/hip pain. Consider arthritis, bursitis, sciatica. Will check UA, send for Xray of hip and give IM pain medication for comfort.   ED Course   Clinical Course as of 07/30/23 0543  Wynelle Link Jul 30, 2023  1610 I personally viewed the images from radiology studies and agree with radiologist interpretation: Xray is neg [CS]  0353 Patient reports pain is improved. She will attempt to give a urine specimen now.  [CS]  0537 UA with WBC and LE, but also contaminated. Given her groin and flank pain, consider this could be UTI vs MSK pain. Will give Rx for Naprosyn/Flexeril, Keflex for UA. Already has steroid Rx waiting at pharmacy.  [CS]    Clinical Course User Index [CS] Pollyann Savoy, MD     MDM Rules/Calculators/A&P Medical Decision Making Problems Addressed: Acute cystitis without hematuria: acute illness or injury Acute right-sided low back pain, unspecified whether sciatica present: acute illness or injury Groin pain, right: acute illness or injury  Amount and/or Complexity of Data Reviewed Labs: ordered. Decision-making details documented in ED Course. Radiology: ordered and independent interpretation performed. Decision-making details documented in ED Course.  Risk Prescription drug management. Parenteral controlled substances.     Final Clinical Impression(s) / ED Diagnoses Final diagnoses:  Acute right-sided low back pain, unspecified whether sciatica present  Groin pain, right  Acute cystitis without hematuria    Rx / DC Orders ED Discharge Orders          Ordered    cyclobenzaprine (FLEXERIL) 10 MG tablet  2 times daily PRN        07/30/23 0542    cephALEXin (KEFLEX) 500 MG capsule  2 times daily        07/30/23  0542    naproxen (NAPROSYN) 500 MG tablet  2 times daily        07/30/23 0542             Pollyann Savoy, MD 07/30/23 [phone]

## 2023-07-30 NOTE — ED Triage Notes (Signed)
Pt. Arrived from home with her son. Pt. States was seen at [facility] given medication and sent home. Pt. States she took nap, woke up with worse back pain and came here. Pt. States pain goes to left hip down left leg also.

## 2023-08-22 ENCOUNTER — Ambulatory Visit (INDEPENDENT_AMBULATORY_CARE_PROVIDER_SITE_OTHER): Payer: BC Managed Care – PPO | Admitting: Physician Assistant

## 2023-08-22 ENCOUNTER — Encounter (HOSPITAL_BASED_OUTPATIENT_CLINIC_OR_DEPARTMENT_OTHER): Payer: Self-pay

## 2023-08-22 ENCOUNTER — Other Ambulatory Visit: Payer: Self-pay

## 2023-08-22 ENCOUNTER — Encounter: Payer: Self-pay | Admitting: Physician Assistant

## 2023-08-22 DIAGNOSIS — S20212A Contusion of left front wall of thorax, initial encounter: Secondary | ICD-10-CM | POA: Diagnosis not present

## 2023-08-22 DIAGNOSIS — M5440 Lumbago with sciatica, unspecified side: Secondary | ICD-10-CM

## 2023-08-22 DIAGNOSIS — S299XXA Unspecified injury of thorax, initial encounter: Secondary | ICD-10-CM | POA: Diagnosis present

## 2023-08-22 DIAGNOSIS — Y92410 Unspecified street and highway as the place of occurrence of the external cause: Secondary | ICD-10-CM | POA: Diagnosis not present

## 2023-08-22 DIAGNOSIS — J45909 Unspecified asthma, uncomplicated: Secondary | ICD-10-CM | POA: Insufficient documentation

## 2023-08-22 MED ORDER — MELOXICAM 15 MG PO TABS: 15.0000 mg | ORAL_TABLET | Freq: Every day | ORAL | 0 refills | Status: AC

## 2023-08-22 NOTE — Progress Notes (Signed)
Office Visit Note   Patient: Christina Robles           Date of Birth: [birth year]           MRN: [id number] Visit Date: 08/22/2023              Requested by: Diamantina Providence, FNP [id number] [place],  [place] [address] PCP: Diamantina Providence, FNP   Assessment & Plan: Visit Diagnoses: Low back pain  Assessment & Plan: Visit Diagnoses: Patient is a pleasant 57 year old woman with a known left month history of low back pain going down her right posterior buttock and into her leg.  She denies any injury.  She has not really had any treatment other than taking ibuprofen.  This got significant enough that she went and saw urgent care.  X-rays done of her hip there did not demonstrate any significant  Degenerative changes of the hip.  X-rays today she has a little bit of sclerotic changes but no listhesis or significant loss of joint space.  She has not tried physical therapy and I think this would be a great place for her to start.  Will refer her to PT and then she can follow-up with Ellin Goodie in 1 month.  Also have called her in some meloxicam which she can take instead of ibuprofen understands not to take other anti-inflammatories such as naproxen or ibuprofen while on the meloxicam.  She may take Tylenol in between as needed  Follow-Up Instructions: Return if symptoms worsen or fail to improve.   Orders:  No orders of the defined types were placed in this encounter.  No orders of the defined types were placed in this encounter.     Procedures: No procedures performed   Clinical Data: No additional findings.   Subjective: No chief complaint on file.   HPI pleasant 57 year old woman with 34-month history of low back pain no particular injury.  Has tried steroid Dosepaks without much success is taking ibuprofen.  Denies any loss of bowel or bladder control pain was significant enough that she was seen and evaluated in the emergency room couple weeks ago she  currently taking some Flexeril  Review of Systems  All other systems reviewed and are negative.    Objective: Vital Signs: There were no vitals taken for this visit.  Physical Exam Constitutional:      Appearance: Normal appearance.  Pulmonary:     Effort: Pulmonary effort is normal.  Skin:    General: Skin is warm and dry.  Neurological:     General: No focal deficit present.     Mental Status: She is alert and oriented to person, place, and time.  Psychiatric:        Mood and Affect: Mood normal.        Behavior: Behavior normal.     Ortho Exam Examination of her low back there is no redness no erythema.  She has some tenderness to palpation over her lower back and the right sided paravertebral muscles.  She has a negative straight leg raise she has excellent strength bilaterally with dorsiflexion plantarflexion extension and flexion of her legs sensation is intact Specialty Comments:  No specialty comments available.  Imaging: No results found.   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   Eosinophilic esophagitis 04/20/2022   Esophageal dysphagia  SOB (shortness of breath)    Morbid obesity (HCC)    Asthma exacerbation 08/16/2014   Acute respiratory failure with hypoxia (HCC) 08/16/2014   Hypokalemia 08/16/2014   Leukocytosis 08/16/2014   CAP (community acquired pneumonia)    HTN (hypertension) 09/26/2011   Past Medical History:  Diagnosis Date   Asthma    Blood clot in abdominal vein    Eczema    Eosinophilic esophagitis 04/20/2022   Hiatal hernia    Uterine polyp     Family History  Problem Relation Age of Onset   Asthma Mother    Hypertension Father    Asthma Sister    Asthma Brother    Diabetes Cousin    Colon cancer Neg Hx    Esophageal cancer Neg Hx    Pancreatic cancer Neg Hx    Stomach cancer Neg Hx     Past Surgical  History:  Procedure Laterality Date   BIOPSY  04/19/2022   Procedure: BIOPSY;  Surgeon: Iva Boop, MD;  Location: [facility];  Service: Gastroenterology;;   BIOPSY  12/08/2022   Procedure: BIOPSY;  Surgeon: Jenel Lucks, MD;  Location: [facility];  Service: Gastroenterology;;   Blood clot     After a pregnancy 26 years ago   COLONOSCOPY WITH PROPOFOL N/A 12/08/2022   Procedure: COLONOSCOPY WITH PROPOFOL;  Surgeon: Jenel Lucks, MD;  Location: [facility];  Service: Gastroenterology;  Laterality: N/A;   ESOPHAGEAL DILATION  04/19/2022   Procedure: ESOPHAGEAL DILATION;  Surgeon: Iva Boop, MD;  Location: [facility];  Service: Gastroenterology;;   ESOPHAGOGASTRODUODENOSCOPY (EGD) WITH PROPOFOL N/A 04/19/2022   Procedure: ESOPHAGOGASTRODUODENOSCOPY (EGD) WITH PROPOFOL;  Surgeon: Iva Boop, MD;  Location: [facility];  Service: Gastroenterology;  Laterality: N/A;   ESOPHAGOGASTRODUODENOSCOPY (EGD) WITH PROPOFOL N/A 12/08/2022   Procedure: ESOPHAGOGASTRODUODENOSCOPY (EGD) WITH PROPOFOL;  Surgeon: Jenel Lucks, MD;  Location: [facility];  Service: Gastroenterology;  Laterality: N/A;   POLYPECTOMY  12/08/2022   Procedure: POLYPECTOMY;  Surgeon: Jenel Lucks, MD;  Location: [facility];  Service: Gastroenterology;;   TUBAL LIGATION     Social History   Occupational History   Occupation: EKG tech    Employer: [employer]    Comment: [employer].  Tobacco Use   Smoking status: Former    Current packs/day: 0.00    Average packs/day: 0.3 packs/day for 20.0 years (6.0 ttl pk-yrs)    Types: Cigarettes    Start date: 02/24/1991    Quit date: 02/24/2011    Years since quitting: 12.4    Passive exposure: Past   Smokeless tobacco: Never  Vaping Use   Vaping status: Never Used  Substance and Sexual Activity   Alcohol use: Yes    Comment: occasionally   Drug use: No   Sexual activity: Not on file

## 2023-08-22 NOTE — Addendum Note (Signed)
Addended by: Michaele Offer on: 08/22/2023 02:10 PM   Modules accepted: Orders
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
## 2023-08-23 ENCOUNTER — Emergency Department (HOSPITAL_BASED_OUTPATIENT_CLINIC_OR_DEPARTMENT_OTHER): Payer: BC Managed Care – PPO | Admitting: Radiology

## 2023-08-23 ENCOUNTER — Emergency Department (HOSPITAL_BASED_OUTPATIENT_CLINIC_OR_DEPARTMENT_OTHER)
Admission: EM | Admit: 2023-08-23 | Discharge: 2023-08-23 | Disposition: A | Discharge status: Home / Self Care | Payer: BC Managed Care – PPO | Attending: Emergency Medicine | Admitting: Emergency Medicine

## 2023-08-23 DIAGNOSIS — S20212A Contusion of left front wall of thorax, initial encounter: Secondary | ICD-10-CM

## 2023-08-23 MED ORDER — NAPROXEN 500 MG PO TABS: 500.0000 mg | ORAL_TABLET | Freq: Two times a day (BID) | ORAL | 0 refills | Status: AC

## 2023-08-23 MED ORDER — HYDROCODONE-ACETAMINOPHEN 5-325 MG PO TABS
1.0000 | ORAL_TABLET | Freq: Once | ORAL | Status: AC
  Administered 2023-08-23: 1 via ORAL
  Filled 2023-08-23: qty 1

## 2023-08-23 MED ORDER — KETOROLAC TROMETHAMINE 30 MG/ML IJ SOLN
30.0000 mg | Freq: Once | INTRAMUSCULAR | Status: DC
  Filled 2023-08-23: qty 1
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
## 2023-09-13 ENCOUNTER — Other Ambulatory Visit: Payer: Self-pay

## 2023-09-13 ENCOUNTER — Ambulatory Visit: Payer: BC Managed Care – PPO | Admitting: Physical Therapy

## 2023-09-13 ENCOUNTER — Encounter: Payer: Self-pay | Admitting: Physical Therapy

## 2023-09-13 DIAGNOSIS — M6281 Muscle weakness (generalized): Secondary | ICD-10-CM

## 2023-09-13 DIAGNOSIS — M5459 Other low back pain: Secondary | ICD-10-CM | POA: Diagnosis not present

## 2023-09-13 DIAGNOSIS — R262 Difficulty in walking, not elsewhere classified: Secondary | ICD-10-CM | POA: Diagnosis not present
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
## 2023-09-16 ENCOUNTER — Other Ambulatory Visit: Payer: Self-pay | Admitting: Internal Medicine

## 2023-09-16 DIAGNOSIS — K200 Eosinophilic esophagitis: Secondary | ICD-10-CM

## 2023-09-19 ENCOUNTER — Other Ambulatory Visit: Payer: Self-pay

## 2023-10-02 ENCOUNTER — Encounter: Payer: BC Managed Care – PPO | Admitting: Physical Therapy

## 2023-10-11 ENCOUNTER — Encounter: Payer: BC Managed Care – PPO | Admitting: Physical Therapy

## 2023-10-25 ENCOUNTER — Encounter: Payer: BC Managed Care – PPO | Admitting: Physical Therapy

## 2023-10-30 ENCOUNTER — Encounter: Payer: Self-pay | Admitting: Gastroenterology

## 2024-05-09 ENCOUNTER — Other Ambulatory Visit: Payer: Self-pay | Admitting: Internal Medicine

## 2024-05-09 DIAGNOSIS — K200 Eosinophilic esophagitis: Secondary | ICD-10-CM

## 2024-05-20 ENCOUNTER — Encounter (HOSPITAL_COMMUNITY): Payer: Self-pay | Admitting: Obstetrics and Gynecology

## 2024-05-20 NOTE — Progress Notes (Signed)
 Spoke w/ via phone for pre-op interview--- [facility] Lab needs dos---- BMP and EKG per anesthesia. Surgeon orders requested 05/20/24.        Lab results------ COVID test -----patient states asymptomatic no test needed Arrive at -------0530 NPO after MN NO Solid Food.  Pre-Surgery Ensure or G2:  Med rec completed Medications to take morning of surgery ----- Prilosec and Symbicort . Diabetic medication -----  GLP1 agonist last dose: GLP1 instructions:  Patient instructed no nail polish to be worn day of surgery Patient instructed to bring photo id and insurance card day of surgery Patient aware to have Driver (ride ) / caregiver    for 24 hours after surgery - Mother Montie Ruth Patient Special Instructions ----- Pre-Op special Instructions -----  Patient verbalized understanding of instructions that were given at this phone interview. Patient denies chest pain, sob, fever, cough at the interview.

## 2024-05-21 NOTE — H&P (Signed)
 Christina Robles is an 58 y.o. female. With polyp.  She had PMB and imaging showed multiple polyps.      Past Medical History:  Diagnosis Date   Asthma    Blood clot in abdominal vein    Eczema    Eosinophilic esophagitis 04/20/2022   Hiatal hernia    Hypertension    Uterine polyp     Past Surgical History:  Procedure Laterality Date   BIOPSY  04/19/2022   Procedure: BIOPSY;  Surgeon: Avram Lupita BRAVO, MD;  Location: [facility];  Service: Gastroenterology;;   BIOPSY  12/08/2022   Procedure: BIOPSY;  Surgeon: Stacia Glendia BRAVO, MD;  Location: [facility];  Service: Gastroenterology;;   Blood clot     After a pregnancy 26 years ago   COLONOSCOPY WITH PROPOFOL  N/A 12/08/2022   Procedure: COLONOSCOPY WITH PROPOFOL ;  Surgeon: Stacia Glendia BRAVO, MD;  Location: [facility];  Service: Gastroenterology;  Laterality: N/A;   ESOPHAGEAL DILATION  04/19/2022   Procedure: ESOPHAGEAL DILATION;  Surgeon: Avram Lupita BRAVO, MD;  Location: [facility];  Service: Gastroenterology;;   ESOPHAGOGASTRODUODENOSCOPY (EGD) WITH PROPOFOL  N/A 04/19/2022   Procedure: ESOPHAGOGASTRODUODENOSCOPY (EGD) WITH PROPOFOL ;  Surgeon: Avram Lupita BRAVO, MD;  Location: [facility];  Service: Gastroenterology;  Laterality: N/A;   ESOPHAGOGASTRODUODENOSCOPY (EGD) WITH PROPOFOL  N/A 12/08/2022   Procedure: ESOPHAGOGASTRODUODENOSCOPY (EGD) WITH PROPOFOL ;  Surgeon: Stacia Glendia BRAVO, MD;  Location: [facility];  Service: Gastroenterology;  Laterality: N/A;   POLYPECTOMY  12/08/2022   Procedure: POLYPECTOMY;  Surgeon: Stacia Glendia BRAVO, MD;  Location: [facility];  Service: Gastroenterology;;   TUBAL LIGATION      Family History  Problem Relation Age of Onset   Asthma Mother    Hypertension Father    Asthma Sister    Asthma Brother    Diabetes Cousin    Colon cancer Neg Hx    Esophageal cancer Neg Hx    Pancreatic cancer Neg Hx    Stomach cancer Neg Hx     Social History:  reports that she quit smoking about 13 years  ago. Her smoking use included cigarettes. She started smoking about 33 years ago. She has a 6 pack-year smoking history. She has been exposed to tobacco smoke. She has never used smokeless tobacco. She reports current alcohol use. She reports that she does not use drugs.  Allergies:  Allergies  Allergen Reactions   Robaxin  [Methocarbamol ] Anaphylaxis    Closed my throat up.     Penicillins Nausea And Vomiting and Other (See Comments)    No medications prior to admission.    Review of Systems  Height 5' 2 (1.575 m), weight 117 kg. Physical Exam Gen: well appearing, NAD CV: Reg rate Pulm: NWOB Abd: soft, nondistended, nontender, no masses GYN: uterus 7 week size, no adnexa ttp/CMT Ext: No edema b/l  No results found for this or any previous visit (from the past 24 hours). No results found.  Assessment/Plan: 58 yo G5P4 presents for surgical management of polyps with hysteroscopy, dilation and curettage, possible polypectom and any additional procedures.   Risks discussed including infection, bleeding, damage to surrounding structures, need for additional procedures, postoperative DVT, unexpected pathology, inability to remove all pathology, and fluid overload. All questions answered. Consent signed in office.    Christina Robles 05/21/2024, 12:55 PM

## 2024-05-24 ENCOUNTER — Ambulatory Visit (HOSPITAL_COMMUNITY)

## 2024-05-24 ENCOUNTER — Other Ambulatory Visit: Payer: Self-pay

## 2024-05-24 ENCOUNTER — Encounter (HOSPITAL_COMMUNITY): Payer: Self-pay | Admitting: Obstetrics and Gynecology

## 2024-05-24 ENCOUNTER — Ambulatory Visit (HOSPITAL_COMMUNITY)
Admission: RE | Admit: 2024-05-24 | Discharge: 2024-05-24 | Disposition: A | Discharge status: Home / Self Care | Attending: Obstetrics and Gynecology | Admitting: Obstetrics and Gynecology

## 2024-05-24 ENCOUNTER — Encounter (HOSPITAL_COMMUNITY)
Admission: RE | Disposition: A | Discharge status: Home / Self Care | Payer: Self-pay | Source: Home / Self Care | Attending: Obstetrics and Gynecology

## 2024-05-24 DIAGNOSIS — N840 Polyp of corpus uteri: Secondary | ICD-10-CM | POA: Diagnosis present

## 2024-05-24 DIAGNOSIS — J45909 Unspecified asthma, uncomplicated: Secondary | ICD-10-CM | POA: Diagnosis not present

## 2024-05-24 DIAGNOSIS — N950 Postmenopausal bleeding: Secondary | ICD-10-CM | POA: Diagnosis not present

## 2024-05-24 DIAGNOSIS — I10 Essential (primary) hypertension: Secondary | ICD-10-CM | POA: Insufficient documentation

## 2024-05-24 DIAGNOSIS — Z7722 Contact with and (suspected) exposure to environmental tobacco smoke (acute) (chronic): Secondary | ICD-10-CM | POA: Insufficient documentation

## 2024-05-24 DIAGNOSIS — Z87891 Personal history of nicotine dependence: Secondary | ICD-10-CM | POA: Insufficient documentation

## 2024-05-24 DIAGNOSIS — K219 Gastro-esophageal reflux disease without esophagitis: Secondary | ICD-10-CM | POA: Insufficient documentation

## 2024-05-24 HISTORY — DX: Essential (primary) hypertension: I10

## 2024-05-24 HISTORY — PX: HYSTEROSCOPY WITH D & C: SHX1775

## 2024-05-24 HISTORY — PX: MYOSURE RESECTION: SHX7611

## 2024-05-24 LAB — ABO/RH: ABO/RH(D): B POS

## 2024-05-24 LAB — TYPE AND SCREEN
ABO/RH(D): B POS
Antibody Screen: NEGATIVE

## 2024-05-24 LAB — BASIC METABOLIC PANEL WITH GFR
Anion gap: 12 (ref 5–15)
BUN: 14 mg/dL (ref 6–20)
CO2: 21 mmol/L — ABNORMAL LOW (ref 22–32)
Calcium: 9.1 mg/dL (ref 8.9–10.3)
Chloride: 106 mmol/L (ref 98–111)
Creatinine, Ser: 0.87 mg/dL (ref 0.44–1.00)
GFR, Estimated: >60 mL/min (ref >60)
Glucose, Bld: 101 mg/dL — ABNORMAL HIGH (ref 70–99)
Potassium: 3.6 mmol/L (ref 3.5–5.1)
Sodium: 139 mmol/L (ref 135–145)

## 2024-05-24 LAB — CBC
HCT: 39.1 % (ref 36.0–46.0)
Hemoglobin: 13.3 g/dL (ref 12.0–15.0)
MCH: 27.3 pg (ref 26.0–34.0)
MCHC: 34 g/dL (ref 30.0–36.0)
MCV: 80.1 fL (ref 80.0–100.0)
Platelets: 275 K/uL (ref 150–400)
RBC: 4.88 MIL/uL (ref 3.87–5.11)
RDW: 15.6 % — ABNORMAL HIGH (ref 11.5–15.5)
WBC: 9.2 K/uL (ref 4.0–10.5)
nRBC: 0 % (ref 0.0–0.2)

## 2024-05-24 SURGERY — DILATATION AND CURETTAGE /HYSTEROSCOPY
Anesthesia: General

## 2024-05-24 MED ORDER — LIDOCAINE 2% (20 MG/ML) 5 ML SYRINGE
INTRAMUSCULAR | Status: AC
  Filled 2024-05-24: qty 5

## 2024-05-24 MED ORDER — DEXAMETHASONE SODIUM PHOSPHATE 10 MG/ML IJ SOLN
INTRAMUSCULAR | Status: AC
  Filled 2024-05-24: qty 1

## 2024-05-24 MED ORDER — ORAL CARE MOUTH RINSE: 15.0000 mL | Freq: Once | OROMUCOSAL | Status: AC

## 2024-05-24 MED ORDER — DEXAMETHASONE SODIUM PHOSPHATE 10 MG/ML IJ SOLN
INTRAMUSCULAR | Status: DC | PRN
  Administered 2024-05-24: 10 mg via INTRAVENOUS

## 2024-05-24 MED ORDER — ACETAMINOPHEN 10 MG/ML IV SOLN: 1000.0000 mg | Freq: Once | INTRAVENOUS | Status: DC | PRN

## 2024-05-24 MED ORDER — LIDOCAINE HCL 1 % IJ SOLN
INTRAMUSCULAR | Status: DC | PRN
  Administered 2024-05-24: 10 mL

## 2024-05-24 MED ORDER — SODIUM CHLORIDE 0.9 % IR SOLN
Status: DC | PRN
  Administered 2024-05-24: 3000 mL

## 2024-05-24 MED ORDER — ONDANSETRON HCL 4 MG/2ML IJ SOLN
INTRAMUSCULAR | Status: DC | PRN
  Administered 2024-05-24: 4 mg via INTRAVENOUS

## 2024-05-24 MED ORDER — SODIUM CHLORIDE (PF) 0.9 % IJ SOLN
INTRAMUSCULAR | Status: AC
  Filled 2024-05-24: qty 10

## 2024-05-24 MED ORDER — FENTANYL CITRATE (PF) 250 MCG/5ML IJ SOLN
INTRAMUSCULAR | Status: DC | PRN
  Administered 2024-05-24: 100 ug via INTRAVENOUS

## 2024-05-24 MED ORDER — KETOROLAC TROMETHAMINE 30 MG/ML IJ SOLN
INTRAMUSCULAR | Status: AC
  Filled 2024-05-24: qty 1

## 2024-05-24 MED ORDER — MIDAZOLAM HCL 2 MG/2ML IJ SOLN
INTRAMUSCULAR | Status: AC
Start: 2024-05-24 — End: 2024-05-24
  Filled 2024-05-24: qty 2

## 2024-05-24 MED ORDER — ONDANSETRON HCL 4 MG/2ML IJ SOLN
INTRAMUSCULAR | Status: AC
  Filled 2024-05-24: qty 2

## 2024-05-24 MED ORDER — POVIDONE-IODINE 10 % EX SWAB: 2.0000 | Freq: Once | CUTANEOUS | Status: DC

## 2024-05-24 MED ORDER — LIDOCAINE 2% (20 MG/ML) 5 ML SYRINGE
INTRAMUSCULAR | Status: DC | PRN
Start: 2024-05-24 — End: 2024-05-24
  Administered 2024-05-24: 100 mg via INTRAVENOUS

## 2024-05-24 MED ORDER — KETOROLAC TROMETHAMINE 30 MG/ML IJ SOLN
INTRAMUSCULAR | Status: DC | PRN
  Administered 2024-05-24: 30 mg via INTRAVENOUS

## 2024-05-24 MED ORDER — PHENYLEPHRINE 80 MCG/ML (10ML) SYRINGE FOR IV PUSH (FOR BLOOD PRESSURE SUPPORT)
PREFILLED_SYRINGE | INTRAVENOUS | Status: DC | PRN
  Administered 2024-05-24: 80 ug via INTRAVENOUS

## 2024-05-24 MED ORDER — PHENYLEPHRINE 80 MCG/ML (10ML) SYRINGE FOR IV PUSH (FOR BLOOD PRESSURE SUPPORT)
PREFILLED_SYRINGE | INTRAVENOUS | Status: AC
  Filled 2024-05-24: qty 10

## 2024-05-24 MED ORDER — LIDOCAINE HCL 1 % IJ SOLN
INTRAMUSCULAR | Status: AC
  Filled 2024-05-24: qty 20

## 2024-05-24 MED ORDER — CHLORHEXIDINE GLUCONATE 0.12 % MT SOLN
OROMUCOSAL | Status: DC
Start: 2024-05-24 — End: 2024-05-24
  Filled 2024-05-24: qty 15

## 2024-05-24 MED ORDER — CHLORHEXIDINE GLUCONATE 0.12 % MT SOLN
15.0000 mL | Freq: Once | OROMUCOSAL | Status: AC
  Administered 2024-05-24: 15 mL via OROMUCOSAL

## 2024-05-24 MED ORDER — PROPOFOL 10 MG/ML IV BOLUS
INTRAVENOUS | Status: DC | PRN
  Administered 2024-05-24: 200 mg via INTRAVENOUS

## 2024-05-24 MED ORDER — LACTATED RINGERS IV SOLN: INTRAVENOUS | Status: DC

## 2024-05-24 MED ORDER — FENTANYL CITRATE (PF) 250 MCG/5ML IJ SOLN
INTRAMUSCULAR | Status: AC
  Filled 2024-05-24: qty 5

## 2024-05-24 MED ORDER — FENTANYL CITRATE (PF) 100 MCG/2ML IJ SOLN: 25.0000 ug | INTRAMUSCULAR | Status: DC | PRN

## 2024-05-24 SURGICAL SUPPLY — 17 items
CATH ROBINSON RED A/P 16FR (CATHETERS) ×3 IMPLANT
DEVICE MYOSURE LITE (MISCELLANEOUS) IMPLANT
DEVICE MYOSURE REACH (MISCELLANEOUS) IMPLANT
DILATOR CANAL MILEX (MISCELLANEOUS) IMPLANT
GLOVE BIO SURGEON STRL SZ 6.5 (GLOVE) ×3 IMPLANT
GLOVE BIOGEL PI IND STRL 6.5 (GLOVE) ×3 IMPLANT
GLOVE SURG UNDER POLY LF SZ7 (GLOVE) ×3 IMPLANT
GOWN STRL REUS W/ TWL LRG LVL3 (GOWN DISPOSABLE) ×6 IMPLANT
KIT PROCEDURE FLUENT (KITS) ×3 IMPLANT
KIT TURNOVER KIT B (KITS) ×3 IMPLANT
PACK VAGINAL MINOR WOMEN LF (CUSTOM PROCEDURE TRAY) ×3 IMPLANT
PAD OB MATERNITY 11 LF (PERSONAL CARE ITEMS) ×3 IMPLANT
SEAL ROD LENS SCOPE MYOSURE (ABLATOR) ×3 IMPLANT
SOL .9 NS 3000ML IRR UROMATIC (IV SOLUTION) IMPLANT
SYSTEM TISS REMOVAL MYSR XL RM (MISCELLANEOUS) IMPLANT
TOWEL GREEN STERILE FF (TOWEL DISPOSABLE) ×6 IMPLANT
UNDERPAD 30X36 HEAVY ABSORB (UNDERPADS AND DIAPERS) ×3 IMPLANT

## 2024-05-24 NOTE — Anesthesia Preprocedure Evaluation (Signed)
 Anesthesia Evaluation  Patient identified by MRN, date of birth, ID band Patient awake    Reviewed: Allergy  & Precautions, NPO status , Patient's Chart, lab work & pertinent test results  Airway Mallampati: II  TM Distance: >3 FB Neck ROM: Full    Dental no notable dental hx.    Pulmonary asthma , former smoker   Pulmonary exam normal        Cardiovascular hypertension,  Rhythm:Regular Rate:Normal     Neuro/Psych negative neurological ROS  negative psych ROS   GI/Hepatic Neg liver ROS, hiatal hernia,GERD  Medicated,,  Endo/Other  negative endocrine ROS    Renal/GU negative Renal ROS  Female GU complaint     Musculoskeletal negative musculoskeletal ROS (+)    Abdominal Normal abdominal exam  (+)   Peds  Hematology negative hematology ROS (+)   Anesthesia Other Findings   Reproductive/Obstetrics                              Anesthesia Physical Anesthesia Plan  ASA: 2  Anesthesia Plan: General   Post-op Pain Management:    Induction: Intravenous  PONV Risk Score and Plan: 3 and Ondansetron , Dexamethasone , Midazolam  and Treatment may vary due to age or medical condition  Airway Management Planned: Mask and LMA  Additional Equipment: None  Intra-op Plan:   Post-operative Plan: Extubation in OR  Informed Consent: I have reviewed the patients History and Physical, chart, labs and discussed the procedure including the risks, benefits and alternatives for the proposed anesthesia with the patient or authorized representative who has indicated his/her understanding and acceptance.     Dental advisory given  Plan Discussed with: CRNA  Anesthesia Plan Comments:         Anesthesia Quick Evaluation

## 2024-05-24 NOTE — Progress Notes (Signed)
No updates to above H&P. Patient arrived NPO and was consented in PACU. Risks again discussed, all questions answered, and consent signed. Proceed with above surgery.    Kailo Kosik MD  

## 2024-05-24 NOTE — Transfer of Care (Signed)
 Immediate Anesthesia Transfer of Care Note  Patient: Christina Robles  Procedure(s) Performed: DILATATION AND CURETTAGE LELDON NAIL RESECTION  Patient Location: PACU  Anesthesia Type:General  Level of Consciousness: awake, alert , oriented, and patient cooperative  Airway & Oxygen Therapy: Patient Spontanous Breathing  Post-op Assessment: Report given to RN, Post -op Vital signs reviewed and stable, Patient moving all extremities X 4, and Patient able to stick tongue midline  Post vital signs: Reviewed and stable  Last Vitals:  Vitals Value Taken Time  BP 127/79 05/24/24 08:11  Temp 36.6 C 05/24/24 08:11  Pulse 79 05/24/24 08:11  Resp 16 05/24/24 08:11  SpO2 96 % 05/24/24 08:11  Vitals shown include unfiled device data.  Last Pain:  Vitals:   05/24/24 0548  TempSrc: Oral  PainSc: 0-No pain      Patients Stated Pain Goal: 8 (05/24/24 0548)  Complications: No notable events documented.

## 2024-05-24 NOTE — Op Note (Signed)
 PREOPERATIVE DIAGNOSES: 1. Polyps 2. Postmenopausal bleedin  POSTOPERATIVE DIAGNOSES: Same  PROCEDURE PERFORMED: Dilation, curretage, polypectomy, hysteroscopy  SURGEON: Dr. Kelly Milian  ANESTHESIA: General  ESTIMATED BLOOD LOSS: 25 cc.  COMPLICATIONS: None  TUBES: None.  DRAINS: None  PATHOLOGY: Endometrial curretings and polyp  FINDINGS: On exam, under anesthesia, normal appearing vulva and vagina, 8 week sized uterus  Operative findings demonstrated several  polyps and otherwise atrophic endometriosis. B/l ostia visualized  Procedure: The patient was taken to the operating room where she was properly prepped and draped in sterile manner under general anesthesia. After bimanual examination, the cervix was exposed with a weighted vaginal speculum and the anterior lip of the cervix grasped with a tenaculum.  The endocervical canal was then progressively dilated to 8mm. The hysteroscope was then introduced into the uterine cavity using sterile saline solution as a distending media and with attached video camera. The endometrial cavity was distended with fluids and the cavity with the b/l ostia were visualized. Above operative findings.Myosure device used to resect polyps.  Sharp curretage was then performed. The scope was reintroduced and several pictures were taken of the endometrial cavity and the hysteroscope removed from the cavity. The sponge and lap counts were correct times 2 at this time. The patient's procedure was terminated. We then awakened her. She was sent to the Recovery Room in good condition.    Kelly Milian MD

## 2024-05-24 NOTE — Discharge Instructions (Signed)
     No ibuprofen, Advil, Aleve , Motrin, ketorolac , meloxicam , naproxen , or other NSAIDS until after 2:00 pm today if needed.

## 2024-05-24 NOTE — Anesthesia Postprocedure Evaluation (Signed)
 Anesthesia Post Note  Patient: Christina Robles  Procedure(s) Performed: DILATATION AND CURETTAGE /HYSTEROSCOPY MYOSURE RESECTION     Patient location during evaluation: PACU Anesthesia Type: General Level of consciousness: awake and alert Pain management: pain level controlled Vital Signs Assessment: post-procedure vital signs reviewed and stable Respiratory status: spontaneous breathing, nonlabored ventilation, respiratory function stable and patient connected to nasal cannula oxygen Cardiovascular status: blood pressure returned to baseline and stable Postop Assessment: no apparent nausea or vomiting Anesthetic complications: no   No notable events documented.  Last Vitals:  Vitals:   05/24/24 0845 05/24/24 0900  BP: 132/76 129/81  Pulse: 62 69  Resp: 14 10  Temp:    SpO2: 97% 99%    Last Pain:  Vitals:   05/24/24 0900  TempSrc:   PainSc: 2                  Cordella SQUIBB Abigaelle Verley

## 2024-05-24 NOTE — Anesthesia Procedure Notes (Signed)
 Procedure Name: LMA Insertion Date/Time: 05/24/2024 7:41 AM  Performed by: Viviana Almarie DASEN, CRNAPre-anesthesia Checklist: Patient identified, Emergency Drugs available, Suction available and Patient being monitored Patient Re-evaluated:Patient Re-evaluated prior to induction Oxygen Delivery Method: Circle System Utilized Preoxygenation: Pre-oxygenation with 100% oxygen Induction Type: IV induction Ventilation: Mask ventilation without difficulty LMA: LMA inserted LMA Size: 4.0 Tube type: Oral Number of attempts: 1 Airway Equipment and Method: Bite block Placement Confirmation: positive ETCO2 Tube secured with: Tape Dental Injury: Teeth and Oropharynx as per pre-operative assessment

## 2024-05-24 NOTE — H&P (View-Only) (Signed)
No updates to above H&P. Patient arrived NPO and was consented in PACU. Risks again discussed, all questions answered, and consent signed. Proceed with above surgery.    Kailo Kosik MD  

## 2024-05-24 NOTE — Interval H&P Note (Signed)
 History and Physical Interval Note:  05/24/2024 7:27 AM  Christina Robles  has presented today for surgery, with the diagnosis of POLYP.  The various methods of treatment have been discussed with the patient and family. After consideration of risks, benefits and other options for treatment, the patient has consented to  Procedure(s): DILATATION AND CURETTAGE /HYSTEROSCOPY (N/A) as a surgical intervention.  The patient's history has been reviewed, patient examined, no change in status, stable for surgery.  I have reviewed the patient's chart and labs.  Questions were answered to the patient's satisfaction.     [name]

## 2024-05-27 ENCOUNTER — Encounter (HOSPITAL_COMMUNITY): Payer: Self-pay | Admitting: Obstetrics and Gynecology

## 2024-05-27 LAB — SURGICAL PATHOLOGY

## 2024-08-05 ENCOUNTER — Encounter: Payer: Self-pay | Admitting: Radiology

## 2024-09-15 ENCOUNTER — Other Ambulatory Visit: Payer: Self-pay | Admitting: Internal Medicine

## 2024-09-15 DIAGNOSIS — K200 Eosinophilic esophagitis: Secondary | ICD-10-CM
